# Patient Record
Sex: Female | Born: 1935 | Race: White | Hispanic: No | Smoking: Never smoker
Health system: Southern US, Community
[De-identification: ages and names within clinical notes are randomized; demographics above are authoritative.]

## PROBLEM LIST (undated history)

## (undated) DIAGNOSIS — T8859XA Other complications of anesthesia, initial encounter: Secondary | ICD-10-CM

## (undated) DIAGNOSIS — I10 Essential (primary) hypertension: Secondary | ICD-10-CM

## (undated) DIAGNOSIS — I679 Cerebrovascular disease, unspecified: Secondary | ICD-10-CM

## (undated) DIAGNOSIS — Z950 Presence of cardiac pacemaker: Secondary | ICD-10-CM

## (undated) DIAGNOSIS — K219 Gastro-esophageal reflux disease without esophagitis: Secondary | ICD-10-CM

## (undated) DIAGNOSIS — I480 Paroxysmal atrial fibrillation: Secondary | ICD-10-CM

## (undated) DIAGNOSIS — I495 Sick sinus syndrome: Secondary | ICD-10-CM

## (undated) DIAGNOSIS — I639 Cerebral infarction, unspecified: Secondary | ICD-10-CM

## (undated) DIAGNOSIS — E785 Hyperlipidemia, unspecified: Secondary | ICD-10-CM

## (undated) DIAGNOSIS — T4145XA Adverse effect of unspecified anesthetic, initial encounter: Secondary | ICD-10-CM

## (undated) DIAGNOSIS — J189 Pneumonia, unspecified organism: Secondary | ICD-10-CM

## (undated) DIAGNOSIS — R569 Unspecified convulsions: Secondary | ICD-10-CM

## (undated) DIAGNOSIS — M1990 Unspecified osteoarthritis, unspecified site: Secondary | ICD-10-CM

## (undated) DIAGNOSIS — I2699 Other pulmonary embolism without acute cor pulmonale: Secondary | ICD-10-CM

## (undated) DIAGNOSIS — C50919 Malignant neoplasm of unspecified site of unspecified female breast: Secondary | ICD-10-CM

## (undated) DIAGNOSIS — I219 Acute myocardial infarction, unspecified: Secondary | ICD-10-CM

## (undated) DIAGNOSIS — A048 Other specified bacterial intestinal infections: Secondary | ICD-10-CM

## (undated) HISTORY — DX: Hyperlipidemia, unspecified: E78.5

## (undated) HISTORY — DX: Gastro-esophageal reflux disease without esophagitis: K21.9

## (undated) HISTORY — PX: TONSILLECTOMY: SUR1361

## (undated) HISTORY — DX: Unspecified osteoarthritis, unspecified site: M19.90

## (undated) HISTORY — DX: Cerebral infarction, unspecified: I63.9

## (undated) HISTORY — DX: Acute myocardial infarction, unspecified: I21.9

## (undated) HISTORY — PX: MASTECTOMY: SHX3

## (undated) HISTORY — DX: Cerebrovascular disease, unspecified: I67.9

## (undated) HISTORY — DX: Unspecified convulsions: R56.9

## (undated) HISTORY — DX: Essential (primary) hypertension: I10

## (undated) HISTORY — DX: Other pulmonary embolism without acute cor pulmonale: I26.99

## (undated) HISTORY — DX: Malignant neoplasm of unspecified site of unspecified female breast: C50.919

## (undated) HISTORY — DX: Other specified bacterial intestinal infections: A04.8

## (undated) HISTORY — DX: Paroxysmal atrial fibrillation: I48.0

## (undated) HISTORY — DX: Sick sinus syndrome: I49.5

---

## 1998-07-14 ENCOUNTER — Other Ambulatory Visit: Admission: RE | Admit: 1998-07-14 | Discharge: 1998-07-14 | Payer: Self-pay | Admitting: Oncology

## 1998-10-27 ENCOUNTER — Other Ambulatory Visit: Admission: RE | Admit: 1998-10-27 | Discharge: 1998-10-27 | Payer: Self-pay | Admitting: *Deleted

## 1998-11-17 ENCOUNTER — Encounter: Admission: RE | Admit: 1998-11-17 | Discharge: 1998-12-25 | Payer: Self-pay | Admitting: General Surgery

## 2000-06-23 ENCOUNTER — Encounter: Admission: RE | Admit: 2000-06-23 | Discharge: 2000-07-19 | Payer: Self-pay | Admitting: Surgery

## 2000-08-16 ENCOUNTER — Encounter: Admission: RE | Admit: 2000-08-16 | Discharge: 2000-09-27 | Payer: Self-pay | Admitting: Surgery

## 2000-12-20 DIAGNOSIS — A048 Other specified bacterial intestinal infections: Secondary | ICD-10-CM

## 2000-12-20 HISTORY — DX: Other specified bacterial intestinal infections: A04.8

## 2001-01-24 ENCOUNTER — Encounter: Payer: Self-pay | Admitting: Emergency Medicine

## 2001-01-24 ENCOUNTER — Inpatient Hospital Stay (HOSPITAL_COMMUNITY): Admission: EM | Admit: 2001-01-24 | Discharge: 2001-01-25 | Payer: Self-pay | Admitting: Emergency Medicine

## 2001-03-06 ENCOUNTER — Encounter: Payer: Self-pay | Admitting: Gastroenterology

## 2001-03-06 ENCOUNTER — Ambulatory Visit (HOSPITAL_COMMUNITY): Admission: RE | Admit: 2001-03-06 | Discharge: 2001-03-06 | Payer: Self-pay | Admitting: Gastroenterology

## 2001-03-06 ENCOUNTER — Encounter (INDEPENDENT_AMBULATORY_CARE_PROVIDER_SITE_OTHER): Payer: Self-pay | Admitting: *Deleted

## 2001-03-21 ENCOUNTER — Encounter (INDEPENDENT_AMBULATORY_CARE_PROVIDER_SITE_OTHER): Payer: Self-pay | Admitting: *Deleted

## 2001-03-21 ENCOUNTER — Ambulatory Visit (HOSPITAL_COMMUNITY): Admission: RE | Admit: 2001-03-21 | Discharge: 2001-03-21 | Payer: Self-pay | Admitting: Gastroenterology

## 2001-05-16 ENCOUNTER — Ambulatory Visit (HOSPITAL_COMMUNITY): Admission: RE | Admit: 2001-05-16 | Discharge: 2001-05-16 | Payer: Self-pay | Admitting: Gastroenterology

## 2001-05-16 ENCOUNTER — Encounter (INDEPENDENT_AMBULATORY_CARE_PROVIDER_SITE_OTHER): Payer: Self-pay | Admitting: *Deleted

## 2002-01-31 ENCOUNTER — Other Ambulatory Visit: Admission: RE | Admit: 2002-01-31 | Discharge: 2002-01-31 | Payer: Self-pay | Admitting: Obstetrics and Gynecology

## 2002-03-26 ENCOUNTER — Ambulatory Visit (HOSPITAL_COMMUNITY): Admission: RE | Admit: 2002-03-26 | Discharge: 2002-03-26 | Payer: Self-pay | Admitting: Obstetrics and Gynecology

## 2002-03-26 ENCOUNTER — Encounter (INDEPENDENT_AMBULATORY_CARE_PROVIDER_SITE_OTHER): Payer: Self-pay | Admitting: Specialist

## 2002-04-03 ENCOUNTER — Encounter: Admission: RE | Admit: 2002-04-03 | Discharge: 2002-04-03 | Payer: Self-pay | Admitting: *Deleted

## 2002-10-30 ENCOUNTER — Encounter: Payer: Self-pay | Admitting: Internal Medicine

## 2003-02-06 ENCOUNTER — Other Ambulatory Visit: Admission: RE | Admit: 2003-02-06 | Discharge: 2003-02-06 | Payer: Self-pay | Admitting: Obstetrics and Gynecology

## 2003-04-30 ENCOUNTER — Ambulatory Visit (HOSPITAL_COMMUNITY): Admission: RE | Admit: 2003-04-30 | Discharge: 2003-04-30 | Payer: Self-pay | Admitting: Endocrinology

## 2004-02-10 ENCOUNTER — Ambulatory Visit (HOSPITAL_COMMUNITY): Admission: RE | Admit: 2004-02-10 | Discharge: 2004-02-10 | Payer: Self-pay | Admitting: Surgery

## 2004-02-10 ENCOUNTER — Ambulatory Visit (HOSPITAL_BASED_OUTPATIENT_CLINIC_OR_DEPARTMENT_OTHER): Admission: RE | Admit: 2004-02-10 | Discharge: 2004-02-10 | Payer: Self-pay | Admitting: Surgery

## 2004-02-10 ENCOUNTER — Encounter (INDEPENDENT_AMBULATORY_CARE_PROVIDER_SITE_OTHER): Payer: Self-pay | Admitting: *Deleted

## 2004-02-14 ENCOUNTER — Other Ambulatory Visit: Admission: RE | Admit: 2004-02-14 | Discharge: 2004-02-14 | Payer: Self-pay | Admitting: Obstetrics and Gynecology

## 2005-12-20 DIAGNOSIS — I495 Sick sinus syndrome: Secondary | ICD-10-CM

## 2005-12-20 HISTORY — PX: PACEMAKER INSERTION: SHX728

## 2005-12-20 HISTORY — DX: Sick sinus syndrome: I49.5

## 2006-03-03 ENCOUNTER — Inpatient Hospital Stay (HOSPITAL_COMMUNITY): Admission: EM | Admit: 2006-03-03 | Discharge: 2006-03-08 | Payer: Self-pay | Admitting: Emergency Medicine

## 2006-03-03 ENCOUNTER — Ambulatory Visit: Payer: Self-pay | Admitting: Internal Medicine

## 2006-03-04 ENCOUNTER — Encounter: Payer: Self-pay | Admitting: Internal Medicine

## 2006-03-04 ENCOUNTER — Encounter: Payer: Self-pay | Admitting: Vascular Surgery

## 2006-04-27 ENCOUNTER — Ambulatory Visit: Payer: Self-pay | Admitting: Internal Medicine

## 2006-05-17 ENCOUNTER — Encounter: Admission: RE | Admit: 2006-05-17 | Discharge: 2006-06-13 | Payer: Self-pay | Admitting: *Deleted

## 2006-06-14 ENCOUNTER — Encounter: Admission: RE | Admit: 2006-06-14 | Discharge: 2006-07-06 | Payer: Self-pay | Admitting: *Deleted

## 2006-07-07 ENCOUNTER — Encounter: Admission: RE | Admit: 2006-07-07 | Discharge: 2006-07-14 | Payer: Self-pay | Admitting: *Deleted

## 2006-09-24 ENCOUNTER — Inpatient Hospital Stay (HOSPITAL_COMMUNITY): Admission: EM | Admit: 2006-09-24 | Discharge: 2006-09-25 | Payer: Self-pay | Admitting: Emergency Medicine

## 2006-10-18 ENCOUNTER — Ambulatory Visit (HOSPITAL_COMMUNITY): Admission: RE | Admit: 2006-10-18 | Discharge: 2006-10-19 | Payer: Self-pay | Admitting: *Deleted

## 2007-06-26 ENCOUNTER — Encounter (HOSPITAL_BASED_OUTPATIENT_CLINIC_OR_DEPARTMENT_OTHER): Admission: RE | Admit: 2007-06-26 | Discharge: 2007-09-12 | Payer: Self-pay | Admitting: Internal Medicine

## 2007-07-26 ENCOUNTER — Inpatient Hospital Stay (HOSPITAL_COMMUNITY): Admission: EM | Admit: 2007-07-26 | Discharge: 2007-08-01 | Payer: Self-pay | Admitting: Emergency Medicine

## 2007-07-26 ENCOUNTER — Ambulatory Visit: Payer: Self-pay | Admitting: Cardiology

## 2009-08-15 ENCOUNTER — Emergency Department (HOSPITAL_COMMUNITY): Admission: EM | Admit: 2009-08-15 | Discharge: 2009-08-15 | Payer: Self-pay | Admitting: Emergency Medicine

## 2009-09-09 ENCOUNTER — Telehealth: Payer: Self-pay | Admitting: Internal Medicine

## 2009-10-28 ENCOUNTER — Ambulatory Visit: Payer: Self-pay | Admitting: Internal Medicine

## 2009-10-28 DIAGNOSIS — K219 Gastro-esophageal reflux disease without esophagitis: Secondary | ICD-10-CM | POA: Insufficient documentation

## 2009-10-28 DIAGNOSIS — T17308A Unspecified foreign body in larynx causing other injury, initial encounter: Secondary | ICD-10-CM

## 2009-10-28 DIAGNOSIS — R1319 Other dysphagia: Secondary | ICD-10-CM

## 2009-10-30 ENCOUNTER — Ambulatory Visit (HOSPITAL_COMMUNITY): Admission: RE | Admit: 2009-10-30 | Discharge: 2009-10-30 | Payer: Self-pay | Admitting: Internal Medicine

## 2009-11-06 ENCOUNTER — Ambulatory Visit (HOSPITAL_COMMUNITY): Admission: RE | Admit: 2009-11-06 | Discharge: 2009-11-06 | Payer: Self-pay | Admitting: Internal Medicine

## 2009-11-06 ENCOUNTER — Encounter: Payer: Self-pay | Admitting: Internal Medicine

## 2010-04-21 ENCOUNTER — Encounter: Payer: Self-pay | Admitting: Internal Medicine

## 2010-09-23 ENCOUNTER — Telehealth (INDEPENDENT_AMBULATORY_CARE_PROVIDER_SITE_OTHER): Payer: Self-pay | Admitting: *Deleted

## 2010-10-03 ENCOUNTER — Encounter: Payer: Self-pay | Admitting: Internal Medicine

## 2010-10-27 ENCOUNTER — Encounter: Payer: Self-pay | Admitting: Internal Medicine

## 2010-11-06 ENCOUNTER — Ambulatory Visit: Payer: Self-pay | Admitting: Internal Medicine

## 2010-11-06 DIAGNOSIS — I10 Essential (primary) hypertension: Secondary | ICD-10-CM

## 2010-11-06 DIAGNOSIS — I498 Other specified cardiac arrhythmias: Secondary | ICD-10-CM | POA: Insufficient documentation

## 2010-11-06 DIAGNOSIS — I4891 Unspecified atrial fibrillation: Secondary | ICD-10-CM | POA: Insufficient documentation

## 2011-01-12 ENCOUNTER — Encounter (HOSPITAL_BASED_OUTPATIENT_CLINIC_OR_DEPARTMENT_OTHER)
Admission: RE | Admit: 2011-01-12 | Discharge: 2011-01-19 | Payer: Self-pay | Source: Home / Self Care | Attending: General Surgery | Admitting: General Surgery

## 2011-01-18 ENCOUNTER — Ambulatory Visit
Admission: RE | Admit: 2011-01-18 | Discharge: 2011-01-18 | Payer: Self-pay | Source: Home / Self Care | Attending: Vascular Surgery | Admitting: Vascular Surgery

## 2011-01-19 NOTE — Miscellaneous (Signed)
Summary: Device preload  Clinical Lists Changes  Observations: Added new observation of PPM INDICATN: Sick sinus syndrome (10/03/2010 11:46) Added new observation of MAGNET RTE: BOL 85 ERI 65 (10/03/2010 11:46) Added new observation of PPMLEADSTAT2: active (10/03/2010 11:46) Added new observation of PPMLEADSER2: [id number] (10/03/2010 11:46) Added new observation of PPMLEADMOD2: 5076  (10/03/2010 11:46) Added new observation of PPMLEADDOI2: 10/18/2006  (10/03/2010 11:46) Added new observation of PPMLEADLOC2: RV  (10/03/2010 11:46) Added new observation of PPMLEADSTAT1: active  (10/03/2010 11:46) Added new observation of PPMLEADSER1: [id number]  (10/03/2010 11:46) Added new observation of PPMLEADMOD1: 5076  (10/03/2010 11:46) Added new observation of PPMLEADDOI1: 10/18/2006  (10/03/2010 11:46) Added new observation of PPMLEADLOC1: RA  (10/03/2010 11:46) Added new observation of PPM IMP MD: Charlynn Court  (10/03/2010 11:46) Added new observation of PPM DOI: 10/18/2006  (10/03/2010 11:46) Added new observation of PPM SERL#: [id number]  (10/03/2010 11:46) Added new observation of PPM MODL#: [id number]  (10/03/2010 11:46) Added new observation of PACEMAKERMFG: Medtronic  (10/03/2010 11:46) Added new observation of PPM REFER MD: Chip Boer  (10/03/2010 11:46) Added new observation of PACEMAKER MD: Hillis Range, MD  (10/03/2010 11:46)      PPM Specifications Following MD:  Hillis Range, MD     Referring MD:  Chip Boer PPM Vendor:  Medtronic     PPM Model Number:  [id number]     PPM Serial Number:  [id number] PPM DOI:  10/18/2006     PPM Implanting MD:  Charlynn Court  Lead 1    Location: RA     DOI: 10/18/2006     Model #: [id number]     Serial #: [id number]     Status: active Lead 2    Location: RV     DOI: 10/18/2006     Model #: [id number]     Serial #: [id number]     Status: active  Magnet Response Rate:  BOL 85 ERI 65  Indications:  Sick sinus syndrome

## 2011-01-19 NOTE — Cardiovascular Report (Signed)
Summary: Office Visit   Office Visit   Imported By: Roderic Ovens 11/06/2010 16:39:16  _____________________________________________________________________  External Attachment:    Type:   Image     Comment:   External Document

## 2011-01-19 NOTE — Letter (Signed)
Summary: Medication / Emergency Contact Document brought in by Patient  Medication / Emergency Contact Document brought in by Patient   Imported By: Lennie Odor 10/28/2010 11:58:20  _____________________________________________________________________  External Attachment:    Type:   Image     Comment:   External Document

## 2011-01-19 NOTE — Progress Notes (Signed)
Summary: pt calling re appt with device clinic  Phone Note Call from Patient   Caller: Patient [phone] Reason for Call: Talk to Nurse Summary of Call: pt of dr Jess Barters ck due nov 1, was told we would send a letter about rs to our office, but there were certain steps that she needed to take before scheduling   Initial call taken by: Glynda Jaeger,  September 23, 2010 9:25 AM  Follow-up for Phone Call        Spoke with patient and reassured her that we had all her current pacemaker information and we will be in touch to schedule a November appt. with the device clinic for a pacemaker check. Follow-up by: Altha Harm, LPN,  September 23, 2010 6:32 PM

## 2011-01-19 NOTE — Assessment & Plan Note (Signed)
Summary: pacer check/medtronic   Visit Type:  NEW PATIENT Referring Provider:  Dr Elease Hashimoto Primary Provider:  Adrian Prince, MD   History of Present Illness: Danielle Roth is a very pleasant and active 77 yo WF with a h/o HTN, paroxysmal atrial fibrillation, s/p PPM for tachy/bradycardia who presents today to establish care in the pacemaker clinic.  She underwent pacemaker implantation by Dr Reyes Ivan 10/18/06 for sick sinus syndrome.  She reports doing very well since that time.  She remains quite active despite her age.  Her primary concern is nonhealing wound/ pain on her L foot for which she is followed by Dr Evlyn Kanner. Today, she denies symptoms of palpitations, chest pain, shortness of breath, orthopnea, PND, lower extremity edema, dizziness, presyncope, syncope, or neurologic sequela. The patient is tolerating medications without difficulties and is otherwise without complaint today.    Current Medications (verified): 1)  Keppra 250 Mg Tabs (Levetiracetam) .Marland Kitchen.. 1 By Mouth Two Times A Day 2)  Pravachol 40 Mg Tabs (Pravastatin Sodium) .Marland Kitchen.. 1 By Mouth Qd 3)  Prilosec 20 Mg Cpdr (Omeprazole) .Marland Kitchen.. 1 By Mouth Qd 4)  Warfarin Sodium 3 Mg Tabs (Warfarin Sodium) .... Use As Directed By Anticoagualtion Clinic 5)  Glycolax  Powd (Polyethylene Glycol 3350) .... Prn 6)  Timolol Maleate 0.5 % Soln (Timolol Maleate) .... Bid 7)  Lumigan 0.03 % Soln (Bimatoprost) .... Q Hs 8)  Caltrate 600+d Plus 600-400 Mg-Unit Tabs (Calcium Carbonate-Vit D-Min) .Marland Kitchen.. 1 By Mouth Bid 9)  Multivitamins  Tabs (Multiple Vitamin) .... Once Daily 10)  Flonase 50 Mcg/act Susp (Fluticasone Propionate) .... Instill Drops Once Daily 11)  Benazepril Hcl 20 Mg Tabs (Benazepril Hcl) .... Once Daily 12)  Nitrostat 0.4 Mg Subl (Nitroglycerin) .Marland Kitchen.. 1 Tablet Under Tongue At Onset of Chest Pain; You May Repeat Every 5 Minutes For Up To 3 Doses. 13)  Sanctura Xr 60 Mg Xr24h-Cap (Trospium Chloride) .... Once Daily 14)  Allopurinol 300 Mg Tabs  (Allopurinol) .... Once Daily 15)  Systane 0.4-0.3 % Soln (Polyethyl Glycol-Propyl Glycol) .... Uad  Allergies: 1)  ! Sulfa 2)  ! Codeine 3)  ! Iodine  Past History:  Past Medical History: Breast Cancer s/p L mastectomy Glaucoma Hyperlipidemia Hypertension Stroke paroxysmal atrial fibrillation, on coumadin gout SSS s/p PPM 2007 by Dr Reyes Ivan  Past Surgical History: Reviewed history from 11/05/2010 and no changes required. Breast-Mastectomy Left Tonsillectomy Dual-chamber permanent pacemaker implantation-2007  Family History: Reviewed history from 10/28/2009 and no changes required. No FH of Colon Cancer: Family History of Heart Disease: Both Parents Family History of Kidney Disease:Mother  Social History: Occupation: Retired and lives in assisted living in [place]. Patient has never smoked.  Daily Caffeine Use -2 Illicit Drug Use - no Alcohol Use - no  Code status: we discussed in detail today and she wishes to be DNR/DNI  Review of Systems       All systems are reviewed and negative except as listed in the HPI.   Vital Signs:  Patient profile:   77 year old female Height:      65.5 inches Weight:      183 pounds BMI:     30.10 Pulse rate:   64 / minute BP sitting:   144 / 72  (left arm)  Vitals Entered By: Laurance Flatten CMA (November 06, 2010 10:06 AM)  Physical Exam  General:  elderly female, NAD, walks slowly with a rolling walker Head:  normocephalic and atraumatic Eyes:  PERRLA/EOM intact; conjunctiva and lids normal. Mouth:  Teeth, gums and palate normal. Oral mucosa normal. Neck:  supple, no bruits Chest Wall:  R sided pacemaker pocket is well healed Lungs:  Clear bilaterally to auscultation and percussion. Heart:  RRR, no m/r/g Abdomen:  Bowel sounds positive; abdomen soft and non-tender without masses, organomegaly, or hernias noted. No hepatosplenomegaly. Msk:  diffuse muscle atrophy Extremities:  No clubbing or cyanosis.  L foot dressing  is c/d/i Neurologic:  Alert and oriented x 3.   PPM Specifications Following MD:  Hillis Range, MD     Referring MD:  Chip Boer PPM Vendor:  [employer]onic     PPM Model Number:  [id number]     PPM Serial Number:  [id number] PPM DOI:  10/18/2006     PPM Implanting MD:  Charlynn Court  Lead 1    Location: RA     DOI: 10/18/2006     Model #: 5076     Serial #: [id number]     Status: active Lead 2    Location: RV     DOI: 10/18/2006     Model #: 3086     Serial #: [id number]     Status: active  Magnet Response Rate:  BOL 85 ERI 65  Indications:  Sick sinus syndrome   PPM Follow Up Remote Check?  No Battery Voltage:  3.00 V     Pacer Dependent:  No       PPM Device Measurements Atrium  Amplitude: 3.6 mV, Impedance: 416 ohms, Threshold: 1.0 V at 0.4 msec Right Ventricle  Amplitude: 12.8 mV, Impedance: 488 ohms, Threshold: 0.5 V at 0.4 msec  Episodes Danielle Episodes:  26     Percent Mode Switch:  1.5%     Coumadin:  Yes Atrial Pacing:  91.6%     Ventricular Pacing:  0.2%  Parameters Mode:  DDDR+     Lower Rate Limit:  60     Upper Rate Limit:  130 Paced AV Delay:  180     Sensed AV Delay:  150 Next Cardiology Appt Due:  04/20/2011 Tech Comments:  No parameter changes.  Device function normal.  Ventricular rates > 100bpm during A-fib, + coumadin.  Rate response blunted but adequate for the patients level of activity.   No Carelink @ this time.  ROV 6 months clinic. Altha Harm, LPN  November 06, 2010 10:16 AM  MD Comments:  agree  Impression & Recommendations:  Problem # 1:  ATRIAL FIBRILLATION (ICD-427.31) stable continue coumadin for stroke prevention  Problem # 2:  BRADYCARDIA (ICD-427.89) normal pacemaker function no changes today  Problem # 3:  ESSENTIAL HYPERTENSION, BENIGN (ICD-401.1) stable no changes today  Patient Instructions: 1)  Your physician wants you to follow-up in: 6 months in the device clinic on Tues/Thurs/Fri  You will receive a reminder letter in the  mail two months in advance. If you don't receive a letter, please call our office to schedule the follow-up appointment.

## 2011-01-21 NOTE — Letter (Signed)
Summary: [facility] Progress Note   [facility] Progress Note   Imported By: Roderic Ovens 01/05/2011 14:56:04  _____________________________________________________________________  External Attachment:    Type:   Image     Comment:   External Document

## 2011-01-26 ENCOUNTER — Encounter (HOSPITAL_BASED_OUTPATIENT_CLINIC_OR_DEPARTMENT_OTHER): Payer: Medicare Other | Attending: General Surgery

## 2011-01-26 DIAGNOSIS — L97509 Non-pressure chronic ulcer of other part of unspecified foot with unspecified severity: Secondary | ICD-10-CM | POA: Insufficient documentation

## 2011-01-26 DIAGNOSIS — M1A9XX1 Chronic gout, unspecified, with tophus (tophi): Secondary | ICD-10-CM | POA: Insufficient documentation

## 2011-01-27 NOTE — Progress Notes (Signed)
Summary: Patient Medical History and Concerns   Patient Medical History and Concerns   Imported By: Roderic Ovens 01/15/2011 13:15:03  _____________________________________________________________________  External Attachment:    Type:   Image     Comment:   External Document

## 2011-03-02 ENCOUNTER — Encounter (HOSPITAL_BASED_OUTPATIENT_CLINIC_OR_DEPARTMENT_OTHER): Payer: Medicare Other | Attending: Plastic Surgery

## 2011-03-02 DIAGNOSIS — L97509 Non-pressure chronic ulcer of other part of unspecified foot with unspecified severity: Secondary | ICD-10-CM | POA: Insufficient documentation

## 2011-03-02 DIAGNOSIS — M1A9XX1 Chronic gout, unspecified, with tophus (tophi): Secondary | ICD-10-CM | POA: Insufficient documentation

## 2011-03-09 NOTE — Miscellaneous (Signed)
Summary: Declaration of a Desire for a Natural Death  Declaration of a Desire for a Natural Death   Imported By: Lenard Forth 03/02/2011 17:44:58  _____________________________________________________________________  External Attachment:    Type:   Image     Comment:   External Document

## 2011-03-23 ENCOUNTER — Encounter (HOSPITAL_BASED_OUTPATIENT_CLINIC_OR_DEPARTMENT_OTHER): Payer: Medicare Other

## 2011-03-27 LAB — CBC
Hemoglobin: 13.6 g/dL (ref 12.0–15.0)
MCV: 89.4 fL (ref 78.0–100.0)
RBC: 4.44 MIL/uL (ref 3.87–5.11)

## 2011-03-27 LAB — CK TOTAL AND CKMB (NOT AT ARMC)
Relative Index: INVALID (ref 0.0–2.5)
Total CK: 63 U/L (ref 7–177)

## 2011-03-27 LAB — PROTIME-INR: Prothrombin Time: 26.4 seconds — ABNORMAL HIGH (ref 11.6–15.2)

## 2011-03-27 LAB — BASIC METABOLIC PANEL
BUN: 20 mg/dL (ref 6–23)
CO2: 23 mEq/L (ref 19–32)
Chloride: 108 mEq/L (ref 96–112)
Creatinine, Ser: 0.94 mg/dL (ref 0.4–1.2)
GFR calc Af Amer: >60 mL/min (ref >60)
Glucose, Bld: 99 mg/dL (ref 70–99)
Potassium: 3.9 mEq/L (ref 3.5–5.1)
Sodium: 138 mEq/L (ref 135–145)

## 2011-03-27 LAB — DIFFERENTIAL
Eosinophils Absolute: 0.3 10*3/uL (ref 0.0–0.7)
Eosinophils Relative: 3 % (ref 0–5)
Lymphs Abs: 3.2 10*3/uL (ref 0.7–4.0)
Monocytes Absolute: 1.1 10*3/uL — ABNORMAL HIGH (ref 0.1–1.0)
Monocytes Relative: 13 % — ABNORMAL HIGH (ref 3–12)

## 2011-03-27 LAB — TROPONIN I: Troponin I: 0.03 ng/mL (ref 0.00–0.06)

## 2011-03-27 LAB — APTT: aPTT: 38 seconds — ABNORMAL HIGH (ref 24–37)

## 2011-05-04 NOTE — Assessment & Plan Note (Signed)
[facility]   NAME:  Danielle Roth, Danielle Roth           ACCOUNT NO.:  [id number]   MEDICAL RECORD NO.:  [id number]      DATE OF BIRTH:  1918-02-09   PHYSICIAN:  Maxwell Caul, M.D. VISIT DATE:  07/14/2007                                   OFFICE VISIT   PURPOSE OF TODAY'S VISIT:  [name] is an  77 year old lady we have  been following for a stasis ulcer that was initially traumatic involving  her left leg.  When this initially happened it was probably hematoma.  We debrided this when she first came here on July 10.  Since then she  has been treated with Silverlon and an Unna wrap.  She does not report  any difficulties with the wrap, excessive pain, or fever.   EXAMINATION:  The wound itself is measuring 2.3 x 1.5 x 0.1.  This is  well granulated, in fact on one edge of this it almost appears  hypergranulated.  Nevertheless the wound was clean and had no evidence  of infection, nothing needed to be debrided.   IMPRESSION:  Improved traumatic ulcer complicated by venous stasis.  I  have continued the Silverlon together with an Unna wrap, the edema is  well controlled.  The wound looks well granulated to me.  We will  hopefully see some degree of epithelialization soon.  Generally this  wound appears much improved to me.           ______________________________  Maxwell Caul, M.D.     MGR/MEDQ  D:  07/14/2007  T:  07/15/2007  Job:  [id number]

## 2011-05-04 NOTE — Assessment & Plan Note (Signed)
[facility]   NAME:  DANNELLE, RHYMES           ACCOUNT NO.:  [id number]   MEDICAL RECORD NO.:  [id number]      DATE OF BIRTH:  April 03, 1918   PHYSICIAN:  Theresia Majors. Tanda Rockers, M.D. VISIT DATE:  07/20/2007                                   OFFICE VISIT   SUBJECTIVE:  Ms. Beazley returns for followup of a stasis ulcer  involving her left lower extremity.  In the interim, we have treated her  with compression wrap and Silverlon.  There has been no interim  excessive drainage, malodor, pain or fever.   OBJECTIVE:  Inspection of the left lower extremity shows that there has  been adequate compression as manifested by lineal wrinkles.  There is  some desquamation of the skin.  The foot is warm with a palpable pulse.  The ulcer itself shows healthy granulation with advancing epithelium at  the periphery.   ASSESSMENT:  Clinical improvement of stasis ulcer.   PLAN:  We will resume the Unna wrap with triamcinolone cream and  SofSorb.  We will re-evaluate her in 1 week.      Harold A. Tanda Rockers, M.D.  Electronically Signed     HAN/MEDQ  D:  07/20/2007  T:  07/21/2007  Job:  [id number]

## 2011-05-04 NOTE — Consult Note (Signed)
NAMEDELARA, Danielle Roth           ACCOUNT NO.:  [id number]   MEDICAL RECORD NO.:  [id number]          PATIENT TYPE:  INP   LOCATION:  4735                         FACILITY:  [facility]   PHYSICIAN:  Vesta Mixer, M.D. DATE OF BIRTH:  [birth year]   DATE OF CONSULTATION:  DATE OF DISCHARGE:                                 CONSULTATION   CARDIOLOGY CONSULT NOTE:   TOTAL VISIT TIME:  Approximately 43 minutes.   The patient is full code.  The patient was seen in the emergency room by  Dr. Quita Skye.  The patient was a good historian.  She complained of  some chest pain.   HISTORY OF PRESENT ILLNESS:  Danielle Roth is an 77 year old female with a  history of stroke x2, history of left bundle branch block, she has a  history of sick sinus syndrome status post dual-chamber pacemaker in  October of 2007 with presyncopal symptoms no acute distress bradycardia.  She has a history of right-sided weakness, residually after her stroke.  She also has a history of multiple falls and deconditioning after she  has been relatively sedentary and also a history of lymphedema with a  leg ulcer and significant swelling of the left lower extremity.  She has  a history of a seizure disorder after the stroke with multiple episodes.  She is unsure of the last time she had one.  History of left eye implant  in March of 2008.  History of  transient ischemic attack in October of  2007.  She has a history of breast cancer in 1993 with a left modified  radical mastectomy, Tamoxifen after.  She also has a history of  hypertension, history of glaucoma and she presents with chest pain.  Patient notes she was sitting at the dinner table at [facility], at approximately 5:20 p.m. she notes a sudden  onset of bilateral anterior chest pain, throbbing and deep, 10/10 with  radiation to bilateral arms, severe for about 20 minutes, associated  with severe shortness of breath with dyspnea on exertion  and sweats.  She denies any associated nausea or vomiting, but notes profound  dizziness with it.  The patient's chest pain was decreased after given  aspirin in the ambulance after 9-1-1 was called.  Her pain is a 3-4/10  at the time of interview.  The patient notes she had never had pain like  this before.  The patient was sent to the CT scanner in the emergency  room and was found to have a right upper and lower proximal pulmonary  thrombo embolus.   CARDIAC HISTORY:  Left heart catheterization was done on January 25, 2001 for chest pain showing nonobstructive coronary artery disease with  an ejection fraction by LV gram was 65% with no mitral regurgitation.  She had an adenosine stress test in 2001 that was negative, Cardiolite.   PAST MEDICAL HISTORY:  As above.   PAST SURGICAL HISTORY:  As above.   SOCIAL HISTORY:  She has never smoke or drank.  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   FAMILY HISTORY:  Positive for colon cancer and she had son that died at  the age of 56 of leukemia.   MEDICATIONS:  She is on:  1. Timolol 0.5% solution to each eyes twice a day.  2. Natural Tears 4 times a day.  3. Lumigan 1 drop to each eye q.h.s. 0.03% solution.  4. Aggrenox 200 twice a day.  5. Altace 5 mg daily.  6. Calcium plus D 600.  7. GlycoLax 17 grams as needed for constipation.  8. She is on a multivitamin.  9. She is also on Keppra 250 in the morning and 500 q.h.s.  10.Pravastatin 40 mg daily.  11.She is also on over-the-counter Prilosec.   ALLERGIES:  SHE IS ALLERGIC TO SULFA AND ALLERGIC TO TOPICAL IODINE.  SHE IS ALSO ALLERGIC TO CODEINE.  IT IS NOT CLEAR THE NATURE OF THE  ALLERGIES.   REVIEW OF SYSTEMS:  A 12-point review of systems, reviewed and negative,  unless as stated above.  She denies any fevers or cough.   PHYSICAL EXAMINATION:  VITAL SIGNS:  Temperature, she is afebrile at  98.9 with a blood pressure of 173/72, pulse of 61, respiratory  rate of  14, saturations are 100% on 2 liters nasal cannula.  GENERAL:  She is an elderly female that looks younger than her stated  age, sitting in bed in no acute distress.  HEENT:  Normocephalic, atraumatic.  Her pupils are equal, round and  reactive to light.  Her extraocular movements are intact.  Her  oropharynx is dry.  NECK:  Supple with no  lymphadenopathy, thyromegaly or carotid bruits.  LUNGS:  Clear to auscultation bilaterally, but she has poor effort.  CARDIOVASCULAR EXAM:  Regular rhythm and rate with ectopy with no  murmurs appreciated.  ABDOMEN:  Soft, nontender, nondistended.  No hepatosplenomegaly.  Normoactive bowel sounds.  EXTREMITIES:  She has got 2+-3+ bilateral lower extremity edema, worse  on the right side and she has bandages on the right leg.  NEUROLOGICAL EXAM:  Significant for 4-/5 strength on the right upper and  lower extremities.  It appears to be full on the left.   LABORATORY DATA:  Shows a white count of 10.6, with a hemoglobin of  13.8, hematocrit 39.8, platelets 186, with 67, lymphocytes 22.  Cardia  markers at 1900, showed a troponin of 1.8, 2 MB of 27.6 and an index of  11, CK of 252.  Basic metabolic panel showed a sodium of 139, potassium  4.4, chloride 105, bicarb 26, BUN 17, creatinine 0.98 with a glucose of  108, calcium 10.5.  INR 1.0, PTT 26, PT 13.6, D-dimer 2.83, which is  significantly elevated.  CT of chest as noted above.   The patient has an acute pulmonary thromboembolism and will remain on a  heparin drip. The patient's case was discussed with Dr. Elease Hashimoto and we  are discontinuing Integrilin drip here in the ER and we will discuss  with the emergency doctor for the best course of her further management.      Darryl D. Prime, MD  Electronically Signed     ______________________________  Vesta Mixer, M.D.   DDP/MEDQ  D:  07/26/2007  T:  07/27/2007  Job:  [id number]

## 2011-05-04 NOTE — Consult Note (Signed)
NAME:  Danielle Roth, SCAROLA NO.:  [id number]   MEDICAL RECORD NO.:  [id number]          PATIENT TYPE:  EMS   LOCATION:  MAJO                         FACILITY:  [facility]   PHYSICIAN:  Jake Bathe, MD      DATE OF BIRTH:  1918-10-08   DATE OF CONSULTATION:  08/15/2009  DATE OF DISCHARGE:                                 CONSULTATION   CARDIOLOGIST:  Vesta Mixer, MD   PRIMARY CARE PHYSICIAN:  Tera Mater. Evlyn Kanner, MD   REASON FOR CONSULTATION:  Dr. Norlene Campbell of [facility] Emergency Department  requested consultation for the evaluation of chest pain.   HISTORY OF PRESENT ILLNESS:  An 77 year old female with a history of  prior pacemaker placement, pulmonary embolism in 2008 on chronic  Coumadin therapy, 2 prior MIs in the past with cardiac catheterization  last being performed in 2002 showing nonobstructive disease per Dr.  Harvie Bridge last note, hypertension, hyperlipidemia, prior TIA who  presented to [facility] Emergency Department after experiencing chest  pain last night.  At approximately 2 a.m. while sleeping, she woke up  with a central chest pressure as well as discomfort in both hands  bilaterally described as a numbness in her hands and she also had  numbness in her feet bilaterally.  This occurred at 2 a.m. and worried  her.  During that experience, she had some mild shortness of breath.  EMS was called and in the ambulance she was given 3 nitroglycerin which  did nothing for the chest discomfort or arm discomfort.  This did  however make her headache, which had also been concomitant during the  episode of chest discomfort at 2 a.m. worse.  When she was in the  emergency department, an IV was started and she stated that after the IV  was started, her chest pain and symptoms abated.  No specific  medications were given through the IV.   Her first set of cardiac biomarkers are normal.  Her lab work is  unremarkable.  See below.  Chest x-ray showed no evidence of any  acute  changes.  Pacemaker present.   Currently when interviewing her at approximately 7:30 a.m., she is  asymptomatic, not complaining of any chest discomfort, shortness of  breath, or arm pain.  During physical exam, I was able to reproduce the  head and neck discomfort as well as some of the chest discomfort with  palpation.  She admits to having a stressful day yesterday, going to 3  separate doctors' offices on the [place] transportation system from her  nursing home facility.   As of note in 2008, Dr. Elease Hashimoto saw her in consultation after cardiac  biomarkers were elevated and she did in fact have a right-sided  pulmonary embolism at that time.  She has been on chronic Coumadin  therapy with an INR today of 2.5.  She also qualifies for Coumadin  therapy given prior history of atrial fibrillation.  Currently, she is  in sinus rhythm.  She has a caregiver who is at her bedside with her  currently.   PAST MEDICAL HISTORY:  1. Status post  pacemaker.  2. Hypertension.  3. Hyperlipidemia.  4. Two prior heart attacks according to the patient.  She did not      recall having a cardiac catheterization performed; however, one was      performed in February 2002, which showed minor luminal      irregularities with normal LV function.  5. Chronic left bundle-branch block.  6. Seizure disorder on Keppra.  7. Stroke x2 with TIA in October 2007.  8. Glaucoma.  9. Bilateral leg neuropathy.  10.Prior left modified radical mastectomy in 1993.  11.Sick sinus syndrome - Medtronic pacemaker placed.   ALLERGIES:  CODEINE, SULFA DRUGS, IODINE.   MEDICATIONS:  Currently on,  1. Coumadin 4.5 mg once a day.  2. Keppra 250 mg in the morning, 500 mg in the evening.  3. Pravastatin 40 mg at bedtime.  4. Prilosec 20 mg a day.  5. Multivitamin once a day.  6. MiraLax once a day.  7. Timolol eye drops as well as Lumigan eye drops once a day.  8. Altace 5 mg a day.  9. Calcium and vitamin D once a  day.   FAMILY HISTORY:  She states that both her mother and father died of  heart attacks, her mother at age 84 and her father at age 76.  Otherwise  noncontributory.   SOCIAL HISTORY:  She denies any alcohol, tobacco, or illicit drug use,  and she currently resides at [facility] at [facility].   REVIEW OF SYSTEMS:  She does have occasional joint pains.  She denies  any recent syncope, orthopnea, PND, bleeding problems, recent stroke, or  stroke-like symptoms.  No rashes.  She does admit to increased stress  recently.  Unless specified above, all other 12 review of systems  negative.   PHYSICAL EXAMINATION:  VITAL SIGNS:  Blood pressure 146/54, pulse 64  paced currently on telemetry, respirations 16, sating 100% on room air,  temperature is 97.8.  GENERAL:  Alert and oriented in no acute distress, resting comfortably  in bed with her caregiver at bedside, pleasant.  HEENT:  Eyes:  Well-perfused conjunctivae.  EOMI.  No scleral icterus.  NECK:  Supple.  No lymphadenopathy.  No JVD.  No carotid bruits  appreciated.  No thyromegaly.  Upon palpation of the posterior neck, I  was able to reproduce some of her headache symptoms as well as neck  discomfort.  CARDIOVASCULAR:  Regular rate and rhythm without any appreciable  murmurs, rubs, or gallops noted.  Normal PMI.  LUNGS:  Clear to auscultation bilaterally.  Normal respiratory effort.  ABDOMEN:  Soft, nontender.  Normoactive bowel sounds.  Mildly  protuberant.  No bruits.  EXTREMITIES:  No clubbing, cyanosis, or edema with palpable distal  pulses bilaterally.  2+ radial pulses bilaterally.  NEUROLOGIC:  Nonfocal.  No tremors noted.  Normal strength bilaterally.  SKIN:  Warm, dry, and intact.  No rashes noted.  Note, I was able to  reproduce some of her chest discomfort by palpation over chest wall.   DATA:  Prior ECG demonstrated atrial fibrillation with left bundle-  branch block morphology.  No ST  changes noted.  Chest x-ray personally  viewed shows mild cardiomegaly with no acute airspace disease, pacemaker  present.   LABORATORY DATA:  Electrolytes are unremarkable.  CBC is unremarkable  with hemoglobin of 13.6, white count of 8.4, and creatinine is 0.9.  First set of cardiac biomarkers are normal with CK of 71, MB of 2.3,  troponin of 0.02.   ASSESSMENT/PLAN:  An 77 year old female with pacemaker, sick sinus  syndrome, hypertension, hyperlipidemia, prior strokes, prior PE on  therapeutic Coumadin with atypical chest discomfort, which has resolved.  1. Chest pain - she is currently very comfortable in bed with      resolution of her symptoms.  The symptoms resolved she states after      placing the IV.  There was no specific medication given.  She      actually stated she was convinced that some of this was indigestion      to the emergency room physician.  Her first set of cardiac      biomarkers are unremarkable and her EKG is reassuring with left      bundle-branch block, no change.  Chest x-ray shows no evidence of      any pneumonias or any other airspace disease.  She also shows no      evidence of any rashes on her chest wall.  I was able to reproduce      this discomfort from deep palpation and perhaps her chest      discomfort as well as neck discomfort was secondary to      musculoskeletal tension.  I have discussed the case with Dr. Delane Ginger, her cardiologist and we both feel comfortable that if her      second set of cardiac biomarkers are normal that she should be safe      for discharge from the emergency department.  Luckily, she does      have an appointment with Dr. Elease Hashimoto this afternoon where he can      have close followup and reevaluation.  Certainly if any worrisome      symptoms develop or if cardiac biomarkers are abnormal, she will be      admitted to the hospital.  In regards to her prior pulmonary      embolism history, my suspicion for this  is very low and that she is      therapeutic on Coumadin, she is sating 100%, and she is complaining      of no shortness of breath at this time.  2. Hypertension - continue Altace.  3. Hyperlipidemia - continue pravastatin.  4. Pacemaker - functioning well, atrial paced on telemetry.  5. Prior transient ischemic attack - on Coumadin.      Jake Bathe, MD  Electronically Signed     MCS/MEDQ  D:  08/15/2009  T:  08/15/2009  Job:  [id number]   cc:   Marisa Severin, MD  Vesta Mixer, M.D.

## 2011-05-04 NOTE — Assessment & Plan Note (Signed)
[facility]   NAME:  Danielle Roth, Danielle Roth           ACCOUNT NO.:  [id number]   MEDICAL RECORD NO.:  [id number]      DATE OF BIRTH:  [birth year]   PHYSICIAN:  Theresia Majors. Tanda Rockers, M.D. VISIT DATE:  09/07/2007                                   OFFICE VISIT   SUBJECTIVE:  Miss Danielle Roth is an 77 year old lady who we had treated for  a posttraumatic stasis ulcer involving her left lower extremity.  In the  interim, she has worn an Unna wrap.  She returns for follow-up.  There  has been no excessive drainage, malodor, pain or fever.   OBJECTIVE:  Blood pressure is 133/60.  Respirations 16.  Pulse rate 65.  Temperature of 97.6.  Inspection of the left anterior lower extremity  shows that the wound is completely resolved.  There is persistence of 2+  edema, but no evidence of arterial insufficiency.   ASSESSMENT:  Resolved wound.   PLAN:  We are discharging the patient.  We have recommended that she  procure 30 to 40 mm below-the-knee compression hose.  The patient has  deferred the purchase of these garments pending her evaluation by an  orthopedist.      Theresia Majors. Tanda Rockers, M.D.  Electronically Signed     HAN/MEDQ  D:  09/07/2007  T:  09/08/2007  Job:  [id number]   cc:   Jeannett Senior A. Evlyn Kanner, M.D.

## 2011-05-04 NOTE — Discharge Summary (Signed)
Danielle Roth, Danielle Roth           ACCOUNT NO.:  [id number]   MEDICAL RECORD NO.:  [id number]          PATIENT TYPE:  INP   LOCATION:  4735                         FACILITY:  [facility]   PHYSICIAN:  Tera Mater. Evlyn Kanner, M.D. DATE OF BIRTH:  22-Nov-1918   DATE OF ADMISSION:  07/26/2007  DATE OF DISCHARGE:  08/01/2007                               DISCHARGE SUMMARY   DISCHARGE DIAGNOSES:  Are as follows:  1. New onset pulmonary embolism with approximate cause appearing to be      left lower extremity embolization due to stasis ulcer.  2. Distant history of breast cancer.  3. Hypertension, under control.  4. Seizure disorder, stable.  5. Hyperlipidemia, on therapy.  6. Prior strokes with last episode in October 2007.  7. Neuropathy.   Danielle Roth is an 77 year old white female, well-known patient to our  practice.  She presented to my partner, Dr. Ivery Quale,  on July 26, 2007 with acute onset of pulmonary embolism.  Patient has been under  treatment with an Unna boot to the left lower extremity and this had  caused immobility.  The patient has clinically done fairly well during  her hospitalization.  She was seen, initially, by the cardiologist, who  saw her during her acute episode, due to chest pain.  Patient, however,  was admitted to our service.  At the present time, she is now ambulated  fairly well.  O2 SATs are 97%.  She has been weaned off of supplemental  oxygen.  She has had no evidence of significant pulmonary hemorrhage and  has had no hemoptysis or pleuritic chest pain.  Her Coumadin has just  now become stable in the therapeutic range for the last 2 values in the  acceptable greater than 2 range.  Patient has had no bleeding  complications.  She did have some sweats last night, which I do not  think represent anything of significance.  She has had no fever.  Blood  pressure, this morning, is 135/71, pulse 61, respirations 20,  temperature 97.6, O2 SAT is 97%.   LABORATORY DATA:  Her PT/INR this morning is 2.0.  Yesterday was 2.0.  Before that was 1.8.  This was on a dose of 3 mg of Coumadin.  White  count yesterday was 7200, hemoglobin 12.2, platelets 182,000, MCV is  89.6.  Earlier laboratory data at presentation:  White count 10,600,  hemoglobin 13.8, platelets 180,000.  Chemistry:  Sodium 139, potassium  4.4, chloride 105, CO2 of 26, BUN 17, creatinine 0.98, glucose 108.  CK  was 252 with a relative index of 11.0 and a positive troponin of 1.82.  A BNP was slightly elevated at 395.   RADIOLOGY TESTING:  Included CT angio of the chest on July 26, 2007,  which showed right sided pulmonary emboli, cardiomegaly, mild basilar  atelectasis and scarring.  Chest x-ray was cardiomegaly without CHF.   IN SUMMARY:  We have a 77 year old white female, presenting with a  pulmonary embolism, due to left lower extremity DVT.  Patient was  actually seen and evaluated for research project while here, but  randomized to  standard therapy with Lovenox, bridged to Coumadin.  The  patient has done clinically well.  Has not been  hemodynamically  unstable.  She does have positive cardiac enzymes on the basis,  apparently, of this pulmonary embolism.  We are not going to pursue an  extensive cardiac workup, based on this, given the clinical situation.  She has had a prior cath that was fine about 4 years ago.  She has also  had an Adenosine Cardiolite, that was normal, as well.  At the present  time, she is clinically ready for discharge.  We had to wait until she  got therapeutic on her Coumadin.  Her dose at discharge will include 3  mg of Coumadin daily with a check in 2 days.  We will have her off her  Aggrenox until I have talked to neurology.  She will be on Altace 5  twice a day, calcium as before, Timoptic eye drops as before,  Toprol XL 25 daily.  Her Lovenox is ended.  She is actually also on  Keppra 250 in the morning and 500 at bedtime and pravastatin  40 mg  daily.  She is on Lumigan eye drops, as well timolol eye drops, as well.  Patient will be on a regular diet.  She will have follow up with me in 2  weeks after.  Of course, Coumadin will be checked in between.           ______________________________  Tera Mater. Evlyn Kanner, M.D.     SAS/MEDQ  D:  08/01/2007  T:  08/01/2007  Job:  [id number]

## 2011-05-04 NOTE — H&P (Signed)
Danielle Roth, Danielle Roth           ACCOUNT NO.:  [id number]   MEDICAL RECORD NO.:  [id number]          PATIENT TYPE:  INP   LOCATION:  4735                         FACILITY:  [facility]   PHYSICIAN:  Barry Dienes. Eloise Harman, M.D.DATE OF BIRTH:  11-May-1918   DATE OF ADMISSION:  07/26/2007  DATE OF DISCHARGE:                              HISTORY & PHYSICAL   CHIEF COMPLAINT:  Shortness of breath.   HISTORY OF PRESENT ILLNESS:  The patient is an 77 year old white female  who is a resident of the [facility] nursing facility who had the fairly  sudden onset of pleuritic chest pain since around 5:00 p.m. on the day  of admission.  This was associated with mild lightheadedness and  diaphoresis and bilateral arm pain (right greater than left) as well as  mild dyspnea on exertion.  She was transported to the emergency room for  a workup that showed mildly elevated cardiac isoenzymes and pulmonary  embolism.  At the time of my evaluation, she no longer had any chest  discomfort.   PAST MEDICAL HISTORY:  1. Chronic left lower extremity venous stasis ulcer on Unna boot      treatment.  2. October of 2007 - cardiac pacemaker placement for sick sinus      syndrome.  3. Left bundle branch block.  4. 1993 - diagnosis of breast cancer with a left modified radical      mastectomy.  5. Hypertension.  6. Seizure disorder.  7. Dyslipidemia.  8. Strokes x2 with a TIA in October 2007.  9. Glaucoma.  10.Bilateral leg neuropathy.   MEDICATIONS PRIOR TO ADMISSION:  1. Aggrenox one capsule twice daily.  2. Altace one tablet daily.  3. Calcium twice daily.  4. Timolol twice daily.   ALLERGIES:  1. CODEINE.  2. SULFA DRUGS.  3. IODINE.   PAST SURGICAL HISTORY:  1. 1950s - breast biopsies which were unremarkable.  2. 1993 - left modified radical mastectomy and axillary lymph node      dissection.  3. 2002 - colonoscopy.  4. 2003 - endometrial biopsy.  5. October 2007 - cardiac pacemaker placement.   SOCIAL HISTORY:  She has been a widow for several years.  She has two  daughters, one of whom lives in [place] and one in [place].  A  son died at age 78 years due to leukemia.  She has no history of tobacco  or alcohol abuse.  She has requested a do not resuscitate order.   FAMILY HISTORY:  Notable for some close relatives with colon cancer, but  none with diabetes mellitus or coronary artery disease.  There is no  family history of a hypercoagulable disorder.   REVIEW OF SYSTEMS:  No transfusions.  Significant for mild dyspnea on  exertion and pleuritic chest pain.  Generally, she uses a cane to  ambulate and has not had any recent falls.  She denies recent fever,  chills, cough, constipation, diarrhea, rectal bleeding, anxiety, or  depression.   INITIAL PHYSICAL EXAMINATION:  VITAL SIGNS:  Blood pressure 172/76,  pulse 59, respirations 16, temperature 98.9, pulse oxygen saturation  100% on nasal  cannula oxygen.  GENERAL:  She is an elderly white female who is in no apparent distress  while sitting upright in bed on nasal cannula oxygen.  HEENT:  Within normal limits.  NECK:  Supple and without jugular venous distension or carotid bruit.  CHEST:  Clear to auscultation.  HEART:  Regular rate and rhythm without significant murmur or gallop.  ABDOMEN:  Normal bowel sounds and no hepatosplenomegaly or tenderness.  EXTREMITIES:  Bilateral 1+ pitting edema, and the left lower extremity  was wrapped in an [employer].  NEUROLOGIC:  She was alert and well-oriented with a normal affect.  Brief exam showed no focal deficits.   LABORATORY STUDIES:  White blood cells 10.6, hemoglobin 13, hematocrit  39, platelets 180.  Fibrin degradation products 2.83 (less than 0.48).  Serum sodium 139, potassium 4.4, chloride 105, carbon dioxide 26, BUN  17, creatinine 0.98, glucose 108, troponin-I 1.82, CK 252, MB 27.6 (11).   ELECTROCARDIOGRAM:  1. Atrial paced beats.  2. Left bundle branch  block.   CHEST X-RAY:  No acute abnormalities.   CT ANGIOGRAM CHEST:  Mild cardiomegaly with basilar atelectasis.  There  were multiple emboli in the right pulmonary arteries.   IMPRESSION AND PLAN:  1. Pulmonary embolism:  Stable with the initiation of IV heparin      treatment.  We will continue heparin and start Coumadin tomorrow      for a planned 6-9 months of treatment.  At her age, a workup for      hypercoagulable conditions would be of low yield.  It is most      likely that she developed a pulmonary embolism from relative lack      of mobility due to her stasis ulcer.  It is possible that she has      recurrent breast carcinoma causing a hypercoagulable condition.      This can be further evaluated following hospital discharge.  2. Elevated troponin I level.  She may have had a non-Q-wave      myocardial infarction.  A cardiologist is following her for this      issue.  For now, we will continue IV heparin.  I have added a beta      blocker to her regimen.  Further      workup will be per the cardiology consultant.  3. Hypertension:  The systolic blood pressure was somewhat elevated,      and this should improve with the addition of Toprol XL.           ______________________________  Barry Dienes. Eloise Harman, M.D.     DGP/MEDQ  D:  07/27/2007  T:  07/27/2007  Job:  [id number]   cc:   Vesta Mixer, M.D.  Estanislado Pandy, MD  Dr. Ethelene Hal  Dr. Loreta Ave

## 2011-05-07 NOTE — Cardiovascular Report (Signed)
NAMEDORLEEN, KISSEL           ACCOUNT NO.:  [id number]   MEDICAL RECORD NO.:  [id number]          PATIENT TYPE:  OIB   LOCATION:  3713                         FACILITY:  [facility]   PHYSICIAN:  Elmore Guise., M.D.DATE OF BIRTH:  1918/08/04   DATE OF PROCEDURE:  10/18/2006  DATE OF DISCHARGE:                              CARDIAC CATHETERIZATION   PROCEDURES:  Dual-chamber permanent pacemaker implantation.   PRIMARY CARDIOLOGIST:  Vesta Mixer, M.D.   REASON FOR PROCEDURE:  Presyncope with sick sinus syndrome and history of  left bundle branch block   DESCRIPTION OF PROCEDURE:  After appropriate informed consent, the patient  brought to the catheterization lab.  She was prepped and draped in sterile  fashion.  Approximately 20 mL of 1% lidocaine was used for local anesthesia.  A 2-inch incision was made in the right deltopectoral groove.  A  subcutaneous pocket was then made with blunt and Bovie dissection.  Hemostasis was obtained before moving on with the procedure.  A venogram was  then performed showing the root of the right axillary/subclavian vein.  The  right axillary vein was then accessed in one stick. A 7-French safe sheath  was placed over retained wire.  A second wire was placed through the sheath.  The sheath was removed, and two 7-French safe sheaths were then placed at  the area.  The ventricular lead was placed with the following measurements.  The ventricular lead is a Medtronic active fixation 5076 - 52 cm, serial  number [id number].  R waves measured 17.8 mV with impedance of 1011 ohms,  threshold 0.4 volts at 0.5 milliseconds with a current of 0.5 mA; 10-volt  check was negative.  The atrial lead was then placed. The atrial lead is a  Medtronic 5076 - 45 cm, serial number [id number]. The following measurements  were obtained.  The patient did go into atrial fibrillation on placing the  ventricular lead.  Fibrillation waves measured 1.3 mV with impedance  of 658  ohms.  The 10-volt check was negative.  Three attempts were made to pace her  out of atrial fibrillation which were unsuccessful.  The atrial and  ventricular leads were then identified and placed in appropriate portions on  an in-rhythm generator. The generator serial number is [id number]. The  generator was placed into the pocket and sewn into place.  The pocket was  then closed in 3 continuous layers with 2-0 followed by 2-0 followed by 4-0  Vicryl suture.  The patient tolerated the procedure well.  No apparent  complications.  The patient will be transferred from the catheterization lab  to holding area in stable condition.      Elmore Guise., M.D.  Electronically Signed     TWK/MEDQ  D:  10/18/2006  T:  10/18/2006  Job:  [id number]   cc:   Vesta Mixer, M.D.

## 2011-05-07 NOTE — Op Note (Signed)
NAME:  Danielle Roth, Danielle Roth                     ACCOUNT NO.:  [id number]   MEDICAL RECORD NO.:  [id number]                   PATIENT TYPE:  AMB   LOCATION:  DSC                                  FACILITY:  [facility]   PHYSICIAN:  Sandria Bales. Ezzard Standing, M.D.               DATE OF BIRTH:  10/01/1918   DATE OF PROCEDURE:  02/10/2004  DATE OF DISCHARGE:                                 OPERATIVE REPORT   PREOPERATIVE DIAGNOSIS:  1 cm lesion right cheek.   POSTOPERATIVE DIAGNOSIS:  1 cm lesion right cheek.   PROCEDURE:  Excision of lesion right cheek.   SURGEON:  Sandria Bales. Ezzard Standing, M.D.   ANESTHESIA:  Approximately 3 mL of 1% Xylocaine with epinephrine.   COMPLICATIONS:  None.   INDICATIONS FOR PROCEDURE:  Ms. Barrie has had a steadily growing mole on  her right cheek which she now wishes to have excised and comes for excision  of this.   DESCRIPTION OF PROCEDURE:  The patient was placed in a supine position.  Her  right cheek was prepped with Betadine solution and sterilely draped.  The  skin was infiltrated with about 3-4 mL of 1% Xylocaine.  An elliptical  incision was made excising this raised lesion of her right cheek and this  sent to pathology.  The skin edge was elevated slightly on both sides and  closed with interrupted 5-0 nylon suture.  She will see [place] back in one week  for suture removal.                                               Sandria Bales. Ezzard Standing, M.D.    DHN/MEDQ  D:  02/10/2004  T:  02/10/2004  Job:  [id number]   cc:   Jeannett Senior A. Evlyn Kanner, M.D.  [address]  [place]  [place] [address]  Fax: [phone]   Maxie Better, M.D.  [address]  [place]  [place] [address]  Fax: [phone]

## 2011-05-07 NOTE — Procedures (Signed)
. [facility]  Patient:    Danielle Roth, Danielle Roth                    MRN: [id number] Proc. Date: 05/16/01 Adm. Date:  [id number] Attending:  Charna Elveria CC:         Tera Mater. Evlyn Kanner, M.D.   Procedure Report  DATE OF BIRTH:  1918/05/17  PROCEDURE:  Screening colonoscopy.  ENDOSCOPIST:  Anselmo Rod, M.D.  INSTRUMENT USED:  Olympus video colonoscope.  INDICATIONS:  An 77 year old white female with a history of polyps and a personal history of breast cancer.  Rule out recurrent polyps.  PREPROCEDURE PREPARATION:  An informed consent was procured from the patient. The patient was fasted for eight hours prior to the procedure and prepped with a bottle of magnesium citrate and gallon of NuLYTELY the night prior to the procedure.  PREPROCEDURE PHYSICAL EXAMINATION:  VITAL SIGNS:  Stable.  NECK:  Supple.  CHEST:  Clear to auscultation.  HEART:  S1, S2 regular.  ABDOMEN:  Soft with normal bowel sounds.  DESCRIPTION OF PROCEDURE:  The patient was placed in the left lateral decubitus position and sedated with 50 mg of Demerol and 5 mg of Versed intravenously.  Once the patient was adequately sedated and maintained on low-flow oxygen and continuous cardiac monitoring, the Olympus video colonoscope was advanced from the rectum to the cecum without difficulty.  The entire colonic mucosa appeared healthy without erosions, ulcerations, masses, or polyps.  The patient had a very tortuous colon which required change in position from the left lateral supine to the right lateral position, to facilitate adequate visualization of the colon.  IMPRESSION: 1. Healthy-appearing colonic mucosa. 2. Very tortuous colon.  No masses or polyps seen.  RECOMMENDATIONS:  Repeat colorectal cancer screening recommended in the next 10 years, unless the patient would develop any abnormal symptoms in the interim. DD:  05/16/01 TD:  05/16/01 Job:  [id number]

## 2011-05-25 ENCOUNTER — Encounter: Payer: Self-pay | Admitting: Internal Medicine

## 2011-06-18 ENCOUNTER — Ambulatory Visit (INDEPENDENT_AMBULATORY_CARE_PROVIDER_SITE_OTHER): Payer: Medicare Other | Admitting: Internal Medicine

## 2011-06-18 ENCOUNTER — Encounter: Payer: Self-pay | Admitting: Internal Medicine

## 2011-06-18 DIAGNOSIS — I4891 Unspecified atrial fibrillation: Secondary | ICD-10-CM

## 2011-06-18 DIAGNOSIS — I10 Essential (primary) hypertension: Secondary | ICD-10-CM

## 2011-06-18 DIAGNOSIS — I498 Other specified cardiac arrhythmias: Secondary | ICD-10-CM

## 2011-06-18 LAB — PACEMAKER DEVICE OBSERVATION
AL IMPEDENCE PM: 424 Ohm
ATRIAL PACING PM: 61.35
BAMS-0001: 150 {beats}/min
BATTERY VOLTAGE: 3 V
RV LEAD AMPLITUDE: 11.1 mv

## 2011-06-18 NOTE — Assessment & Plan Note (Signed)
She is appropriately anticoagulated with coumadin

## 2011-06-18 NOTE — Patient Instructions (Signed)
Your physician wants you to follow-up in: 6 months with device clinic You will receive a reminder letter in the mail two months in advance. If you don't receive a letter, please call our office to schedule the follow-up appointment.  

## 2011-06-18 NOTE — Progress Notes (Signed)
Danielle Roth is a pleasant 77 y.o. yo patient with a h/o bradycardia sp PPM (MDT) by Dr Reyes Ivan who presents today to establish care in the Electrophysiology device clinic.   The patient reports doing very well since having a pacemaker implanted and remains very active despite her age.   Today, she  denies symptoms of palpitations, chest pain, shortness of breath, orthopnea, PND, dizziness, presyncope, syncope, or neurologic sequela.   She has stable edema.  The patientis tolerating medications without difficulties and is otherwise without complaint today.   Past Medical History  Diagnosis Date  . Breast cancer     left mastectomy  . Glaucoma   . Hyperlipidemia   . Hypertension   . Stroke   . Paroxysmal atrial fibrillation     on coumadin  . Gout   . SSS (sick sinus syndrome) 2007    PPM 2007 by Dr. Reyes Ivan  . Pulmonary embolus     on coumadin    Past Surgical History  Procedure Date  . Mastectomy     left  . Tonsillectomy   . Pacemaker insertion 2007    dual-chamber permanent pacemaker implantation by Dr Reyes Ivan MDT    History   Social History  . Marital Status: Widowed    Spouse Name: N/A    Number of Children: N/A  . Years of Education: N/A   Occupational History  . retired    Social History Main Topics  . Smoking status: Never Smoker   . Smokeless tobacco: Not on file  . Alcohol Use: No  . Drug Use: No  . Sexually Active: Not on file   Other Topics Concern  . Not on file   Social History Narrative   Patient lives in assisted living in [place] at [facility].   Retired Comptroller.Previously expressed wishes for DNI/DNR    Family History  Problem Relation Age of Onset  . Heart disease Mother   . Kidney disease Mother   . Heart disease Father   . Colon cancer Neg Hx     Allergies  Allergen Reactions  . Codeine   . Iodine   . Sulfonamide Derivatives   . Voltaren     Current Outpatient Prescriptions  Medication Sig Dispense Refill  .  allopurinol (ZYLOPRIM) 300 MG tablet Take 300 mg by mouth daily.        . benazepril (LOTENSIN) 20 MG tablet Take 20 mg by mouth daily.        . bimatoprost (LUMIGAN) 0.03 % ophthalmic solution 1 drop at bedtime.        . Calcium Carbonate-Vitamin D (CALTRATE 600+D) 600-400 MG-UNIT per tablet Take 1 tablet by mouth 2 (two) times daily.        . fluticasone (FLONASE) 50 MCG/ACT nasal spray Place 2 sprays into the nose daily.        Marland Kitchen levETIRAcetam (KEPPRA) 250 MG tablet Take 250 mg by mouth every 12 (twelve) hours.       . Multiple Vitamin (MULTIVITAMIN) tablet Take 1 tablet by mouth daily.        . nitroGLYCERIN (NITROSTAT) 0.4 MG SL tablet Place 0.4 mg under the tongue every 5 (five) minutes as needed.        Marland Kitchen omeprazole (PRILOSEC) 20 MG capsule Take 20 mg by mouth daily.        Bertram Gala Glycol-Propyl Glycol (SYSTANE) 0.4-0.3 % SOLN Apply to eye as needed.        . Polyethylene Glycol 3350 (  GLYCOLAX PO) Take by mouth as needed.        . pravastatin (PRAVACHOL) 40 MG tablet Take 40 mg by mouth daily.        . Timolol Maleate 0.5 % (DAILY) SOLN Apply to eye 2 (two) times daily.        . Trospium Chloride 60 MG CP24 Take 60 mg by mouth daily.        Marland Kitchen warfarin (COUMADIN) 3 MG tablet Take 3 mg by mouth. As directed by Anticoagulation Clinic         ROS- all systems are reviewed and negative except as per HPI  Physical Exam: Filed Vitals:   06/18/11 1205  BP: 126/59  Pulse: 61  Height: 5\' 5"  (1.651 m)  Weight: 186 lb (84.369 kg)    GEN- The patient is well appearing, alert and oriented x 3 today.   Head- normocephalic, atraumatic Eyes-  Sclera clear, conjunctiva pink Ears- hearing intact Oropharynx- clear Neck- supple, no JVP Lymph- no cervical lymphadenopathy Lungs- Clear to ausculation bilaterally, normal work of breathing Chest- pacemaker pocket is well healed Heart- Regular rate and rhythm, no murmurs, rubs or gallops, PMI not laterally displaced GI- soft, NT, ND, +  BS Extremities- no clubbing, cyanosis, +2 edema MS- no significant deformity or atrophy Skin- no rash or lesion Psych- euthymic mood, full affect Neuro- strength and sensation are intact  Pacemaker interrogation- reviewed in detail today,  See PACEART report  Assessment and Plan:

## 2011-06-18 NOTE — Assessment & Plan Note (Signed)
Stable No change required today  

## 2011-06-18 NOTE — Assessment & Plan Note (Signed)
Normal pacemaker function See Pace Art report No changes today  

## 2011-10-04 LAB — PROTIME-INR
INR: 1
INR: 1.4
INR: 2 — ABNORMAL HIGH
INR: 2 — ABNORMAL HIGH
Prothrombin Time: 13.3
Prothrombin Time: 23.4 — ABNORMAL HIGH

## 2011-10-04 LAB — DIFFERENTIAL
Basophils Absolute: 0
Basophils Relative: 0
Eosinophils Absolute: 0.2
Eosinophils Relative: 2
Lymphocytes Relative: 22
Lymphs Abs: 2.4
Monocytes Absolute: 0.9 — ABNORMAL HIGH
Monocytes Relative: 8
Neutro Abs: 7.1
Neutrophils Relative %: 67

## 2011-10-04 LAB — CBC
HCT: 35.8 — ABNORMAL LOW
HCT: 36.5
HCT: 37.8
HCT: 39.8
Hemoglobin: 12.2
Hemoglobin: 12.5
Hemoglobin: 13.8
MCHC: 33.9
MCHC: 34.4
MCHC: 34.6
MCV: 89.3
MCV: 90.6
MCV: 90.8
Platelets: 180
Platelets: 189
RBC: 3.94
RBC: 4.05
RBC: 4.17
RBC: 4.45
RDW: 13.4
RDW: 13.5
WBC: 10.6 — ABNORMAL HIGH
WBC: 11 — ABNORMAL HIGH
WBC: 7.1
WBC: 7.2
WBC: 9.2

## 2011-10-04 LAB — BASIC METABOLIC PANEL WITH GFR
BUN: 17
CO2: 26
Chloride: 105
Glucose, Bld: 108 — ABNORMAL HIGH
Potassium: 4.4

## 2011-10-04 LAB — D-DIMER, QUANTITATIVE: D-Dimer, Quant: 2.83 — ABNORMAL HIGH

## 2011-10-04 LAB — APTT: aPTT: 26

## 2011-10-04 LAB — BASIC METABOLIC PANEL
CO2: 27
Calcium: 10.5
Chloride: 108
Creatinine, Ser: 0.98
GFR calc Af Amer: >60
GFR calc Af Amer: >60
GFR calc non Af Amer: 54 — ABNORMAL LOW
Glucose, Bld: 79
Potassium: 3.8
Sodium: 139
Sodium: 139

## 2011-10-04 LAB — TROPONIN I: Troponin I: 1.82

## 2011-10-04 LAB — CK TOTAL AND CKMB (NOT AT ARMC)
CK, MB: 27.6 — ABNORMAL HIGH
Relative Index: 11 — ABNORMAL HIGH
Total CK: 252 — ABNORMAL HIGH

## 2011-10-12 NOTE — H&P (Signed)
  NAME:  Danielle Roth, Danielle Roth           ACCOUNT NO.:  [id number]  MEDICAL RECORD NO.:  [id number]          PATIENT TYPE:  REC  LOCATION:  FOOT                         FACILITY:  [facility]  PHYSICIAN:  Joanne Gavel, M.D.        DATE OF BIRTH:  [birth year]  DATE OF ADMISSION: DATE OF DISCHARGE:                             HISTORY & PHYSICAL   CHIEF COMPLAINT:  Wound, left toe and right anterior leg.  HISTORY OF PRESENT ILLNESS:  This is a 77 year old female with a traumatic wound which has not healed on the dorsum of her left second toe and a brand new wound caused by a trauma of the right foreleg.  PAST MEDICAL HISTORY:  Significant for: 1. Osteoporosis. 2. Hypertension. 3. Hyperlipidemia. 4. Glaucoma. 5. Goiter. 6. Remote history of breast cancer. 7. Gout. 8. Pacemaker.  PAST SURGICAL HISTORY: 1. Colon polyp in 2002. 2. Left modified radical mastectomy in 1993. 3. Cardiac cath in 2002. 4. Pacemaker in 2007. 5. Tonsillectomy. 6. D and C. 7. 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  CIGARETTES:  None.  ALCOHOL:  None.  MEDICATIONS:  Timolol and Lumigan eye drops, benazepril, pravastatin, omeprazole, Coumadin, nitroglycerin, [place] XR, allopurinol.  PHYSICAL EXAMINATION:  VITAL SIGNS:  Temperature 98.9, pulse 63, respirations 20, blood pressure 114/64. EYES, EARS, NOSE, THROAT:  Normal. NECK:  Supple. CHEST:  Clear. HEART:  Regular rhythm at this time. EXTREMITIES:  Bilateral pretibial edema.  There is a 0.4 X 0.4 clean wound on the anterior foreleg on the right side.  There is on the dorsum of the toe which is slightly deformed possibly by gouty tophus, a 0.3 x 0.4 x 0.2 wound also claimed.  I believe that I can palpate dorsalis pedis pulses bilaterally, but they are relatively weak.  ABI is 0.9 and 0.8, right greater than left.  There is good skin nutrition generally and no sign of neuropathy, loss of sensation or movement.  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  PLAN:  As both wounds are clean, there was no treatment with Silver collagen.  I have also ordered vascular studies to make sure that the arterial inflow is adequately healing.     Joanne Gavel, M.D.     RA/MEDQ  D:  01/12/2011  T:  01/13/2011  Job:  [id number]  cc:   Samul Dada, M.D. Tera Mater. Evlyn Kanner, M.D. Sandria Bales. Ezzard Standing, M.D. Richard D. Ethelene Hal, M.D.  Electronically Signed by Joanne Gavel M.D. on 10/12/2011 08:47:57 AM

## 2012-01-20 ENCOUNTER — Encounter: Payer: Medicare Other | Admitting: Internal Medicine

## 2012-02-03 ENCOUNTER — Encounter: Payer: Self-pay | Admitting: Internal Medicine

## 2012-02-03 ENCOUNTER — Ambulatory Visit (INDEPENDENT_AMBULATORY_CARE_PROVIDER_SITE_OTHER): Payer: Medicare Other | Admitting: Internal Medicine

## 2012-02-03 DIAGNOSIS — I4891 Unspecified atrial fibrillation: Secondary | ICD-10-CM

## 2012-02-03 DIAGNOSIS — I498 Other specified cardiac arrhythmias: Secondary | ICD-10-CM

## 2012-02-03 LAB — PACEMAKER DEVICE OBSERVATION
AL IMPEDENCE PM: 408 Ohm
AL THRESHOLD: 1 V
RV LEAD AMPLITUDE: 11.447 mv
RV LEAD IMPEDENCE PM: 616 Ohm

## 2012-02-03 NOTE — Patient Instructions (Signed)
Your physician wants you to follow-up in: 6 months with the device clinic and 12 months with Dr Allred You will receive a reminder letter in the mail two months in advance. If you don't receive a letter, please call our office to schedule the follow-up appointment.  

## 2012-02-03 NOTE — Assessment & Plan Note (Signed)
Normal pacemaker function See Pace Art report No changes today  

## 2012-02-03 NOTE — Progress Notes (Signed)
PCP:  Julian Hy, MD, MD  The patient presents today for routine electrophysiology followup.  Since last being seen in our clinic, the patient reports doing very well.  Today, she denies symptoms of palpitations, chest pain, shortness of breath, orthopnea, PND, lower extremity edema, dizziness, presyncope, syncope, or neurologic sequela.  The patient feels that she is tolerating medications without difficulties and is otherwise without complaint today.   Past Medical History  Diagnosis Date  . Breast cancer     left mastectomy  . Glaucoma   . Hyperlipidemia   . Hypertension   . Stroke   . Paroxysmal atrial fibrillation     on coumadin  . Gout   . SSS (sick sinus syndrome) 2007    PPM 2007 by Dr. Reyes Ivan  . Pulmonary embolus     on coumadin   Past Surgical History  Procedure Date  . Mastectomy     left  . Tonsillectomy   . Pacemaker insertion 2007    dual-chamber permanent pacemaker implantation by Dr Reyes Ivan MDT    Current Outpatient Prescriptions  Medication Sig Dispense Refill  . allopurinol (ZYLOPRIM) 300 MG tablet Take 300 mg by mouth daily.        . benazepril (LOTENSIN) 20 MG tablet Take 20 mg by mouth daily.        . bimatoprost (LUMIGAN) 0.03 % ophthalmic solution 1 drop at bedtime.        . Calcium Carbonate-Vitamin D (CALTRATE 600+D) 600-400 MG-UNIT per tablet Take 1 tablet by mouth 2 (two) times daily.        . fluticasone (FLONASE) 50 MCG/ACT nasal spray Place 2 sprays into the nose daily.        Marland Kitchen levETIRAcetam (KEPPRA) 250 MG tablet Take 250 mg by mouth every 12 (twelve) hours.       . Multiple Vitamin (MULTIVITAMIN) tablet Take 1 tablet by mouth daily.        . nitroGLYCERIN (NITROSTAT) 0.4 MG SL tablet Place 0.4 mg under the tongue every 5 (five) minutes as needed.        Marland Kitchen omeprazole (PRILOSEC) 20 MG capsule Take 20 mg by mouth daily.        Bertram Gala Glycol-Propyl Glycol (SYSTANE) 0.4-0.3 % SOLN Apply to eye as needed.        . Polyethylene Glycol  3350 (GLYCOLAX PO) Take by mouth as needed.        . pravastatin (PRAVACHOL) 40 MG tablet Take 40 mg by mouth daily.        . Timolol Maleate 0.5 % (DAILY) SOLN Apply to eye 2 (two) times daily.        . Trospium Chloride 60 MG CP24 Take 60 mg by mouth daily.        Marland Kitchen warfarin (COUMADIN) 3 MG tablet Take 3 mg by mouth. As directed by Anticoagulation Clinic         Allergies  Allergen Reactions  . Codeine   . Iodine   . Sulfonamide Derivatives   . Voltaren     History   Social History  . Marital Status: Widowed    Spouse Name: N/A    Number of Children: N/A  . Years of Education: N/A   Occupational History  . retired    Social History Main Topics  . Smoking status: Never Smoker   . Smokeless tobacco: Not on file  . Alcohol Use: No  . Drug Use: No  . Sexually Active: Not on file  Other Topics Concern  . Not on file   Social History Narrative   Patient lives in assisted living in [place] at [facility].   Retired Comptroller.Previously expressed wishes for DNI/DNR    Family History  Problem Relation Age of Onset  . Heart disease Mother   . Kidney disease Mother   . Heart disease Father   . Colon cancer Neg Hx    Physical Exam: Filed Vitals:   02/03/12 1436  BP: 116/54  Pulse: 61  Weight: 177 lb 12.8 oz (80.65 kg)    GEN- The patient is well appearing, alert and oriented x 3 today.   Head- normocephalic, atraumatic Eyes-  Sclera clear, conjunctiva pink Ears- hearing intact Oropharynx- clear Neck- supple, no JVP Lymph- no cervical lymphadenopathy Lungs- Clear to ausculation bilaterally, normal work of breathing Chest- pacemaker pocket is well healed Heart- Regular rate and rhythm, no murmurs, rubs or gallops, PMI not laterally displaced GI- soft, NT, ND, + BS Extremities- no clubbing, cyanosis, or edema  Pacemaker interrogation- reviewed in detail today,  See PACEART report  Assessment and Plan:

## 2012-02-03 NOTE — Assessment & Plan Note (Signed)
She is appropriately anticoagulated with coumadin 

## 2012-04-11 ENCOUNTER — Other Ambulatory Visit: Payer: Self-pay | Admitting: Radiology

## 2012-08-16 ENCOUNTER — Encounter: Payer: Self-pay | Admitting: Cardiology

## 2012-08-16 ENCOUNTER — Ambulatory Visit (INDEPENDENT_AMBULATORY_CARE_PROVIDER_SITE_OTHER): Payer: Medicare Other | Admitting: Cardiology

## 2012-08-16 DIAGNOSIS — Z950 Presence of cardiac pacemaker: Secondary | ICD-10-CM

## 2012-08-16 DIAGNOSIS — I495 Sick sinus syndrome: Secondary | ICD-10-CM

## 2012-08-16 LAB — PACEMAKER DEVICE OBSERVATION
AL IMPEDENCE PM: 408 Ohm
ATRIAL PACING PM: 52.3
RV LEAD IMPEDENCE PM: 688 Ohm
VENTRICULAR PACING PM: 0.1

## 2012-08-16 NOTE — Progress Notes (Signed)
Device check only. See PaceArt report. 

## 2012-08-24 ENCOUNTER — Encounter: Payer: Self-pay | Admitting: Internal Medicine

## 2012-09-09 ENCOUNTER — Emergency Department (HOSPITAL_BASED_OUTPATIENT_CLINIC_OR_DEPARTMENT_OTHER)
Admission: EM | Admit: 2012-09-09 | Discharge: 2012-09-09 | Disposition: A | Discharge status: Skilled Nursing Facility | Payer: Medicare Other | Attending: Emergency Medicine | Admitting: Emergency Medicine

## 2012-09-09 ENCOUNTER — Encounter (HOSPITAL_BASED_OUTPATIENT_CLINIC_OR_DEPARTMENT_OTHER): Payer: Self-pay | Admitting: Emergency Medicine

## 2012-09-09 ENCOUNTER — Emergency Department (HOSPITAL_BASED_OUTPATIENT_CLINIC_OR_DEPARTMENT_OTHER): Payer: Medicare Other

## 2012-09-09 DIAGNOSIS — M25559 Pain in unspecified hip: Secondary | ICD-10-CM | POA: Insufficient documentation

## 2012-09-09 DIAGNOSIS — Y9301 Activity, walking, marching and hiking: Secondary | ICD-10-CM | POA: Insufficient documentation

## 2012-09-09 DIAGNOSIS — Z8673 Personal history of transient ischemic attack (TIA), and cerebral infarction without residual deficits: Secondary | ICD-10-CM | POA: Insufficient documentation

## 2012-09-09 DIAGNOSIS — M25519 Pain in unspecified shoulder: Secondary | ICD-10-CM | POA: Insufficient documentation

## 2012-09-09 DIAGNOSIS — Z7901 Long term (current) use of anticoagulants: Secondary | ICD-10-CM | POA: Insufficient documentation

## 2012-09-09 DIAGNOSIS — W010XXA Fall on same level from slipping, tripping and stumbling without subsequent striking against object, initial encounter: Secondary | ICD-10-CM | POA: Insufficient documentation

## 2012-09-09 DIAGNOSIS — Z79899 Other long term (current) drug therapy: Secondary | ICD-10-CM | POA: Insufficient documentation

## 2012-09-09 DIAGNOSIS — Y9210 Unspecified residential institution as the place of occurrence of the external cause: Secondary | ICD-10-CM | POA: Insufficient documentation

## 2012-09-09 DIAGNOSIS — M542 Cervicalgia: Secondary | ICD-10-CM | POA: Insufficient documentation

## 2012-09-09 DIAGNOSIS — S0003XA Contusion of scalp, initial encounter: Secondary | ICD-10-CM | POA: Insufficient documentation

## 2012-09-09 DIAGNOSIS — M25512 Pain in left shoulder: Secondary | ICD-10-CM

## 2012-09-09 NOTE — ED Provider Notes (Signed)
History     CSN: [id number]  Arrival date & time 09/09/12  0520   First MD Initiated Contact with Patient 09/09/12 [phone]      Chief Complaint  Patient presents with  . Fall    (Consider location/radiation/quality/duration/timing/severity/associated sxs/prior treatment) HPI This is a 78 year old white female who was walking with her walker at her living facility this morning. She states her walker skidded out away from her and she fell onto her left side. She is complaining of moderate to severe pain in the left shoulder, worse with movement or palpation. There is a wound to her occiput that has been bleeding. There was no loss of consciousness and she recalls the fall. She denies neck or back pain. She was fully spinally immobilized prior to transport.  Past Medical History  Diagnosis Date  . Breast cancer     left mastectomy  . Glaucoma   . Hyperlipidemia   . Hypertension   . Stroke   . Paroxysmal atrial fibrillation     on coumadin  . Gout   . SSS (sick sinus syndrome) 2007    PPM 2007 by Dr. Reyes Ivan  . Pulmonary embolus     on coumadin    Past Surgical History  Procedure Date  . Mastectomy     left  . Tonsillectomy   . Pacemaker insertion 2007    dual-chamber permanent pacemaker implantation by Dr Reyes Ivan MDT    Family History  Problem Relation Age of Onset  . Heart disease Mother   . Kidney disease Mother   . Heart disease Father   . Colon cancer Neg Hx     History  Substance Use Topics  . Smoking status: Never Smoker   . Smokeless tobacco: Never Used  . Alcohol Use: No    OB History    Grav Para Term Preterm Abortions TAB SAB Ect Mult Living                  Review of Systems  All other systems reviewed and are negative.    Allergies  Codeine; Diclofenac sodium; Iodine; Other; and Sulfonamide derivatives  Home Medications   Current Outpatient Rx  Name Route Sig Dispense Refill  . ALLOPURINOL 300 MG PO TABS Oral Take 300 mg by mouth daily.       Marland Kitchen BENAZEPRIL HCL 20 MG PO TABS Oral Take 20 mg by mouth daily.      Marland Kitchen BIMATOPROST 0.03 % OP SOLN  1 drop at bedtime.      Marland Kitchen CALCIUM CARBONATE-VITAMIN D 600-400 MG-UNIT PO TABS Oral Take 1 tablet by mouth 2 (two) times daily.      Marland Kitchen FLUTICASONE PROPIONATE 50 MCG/ACT NA SUSP Nasal Place 2 sprays into the nose daily.      Marland Kitchen LEVETIRACETAM 250 MG PO TABS Oral Take 250 mg by mouth every 12 (twelve) hours.     Marland Kitchen ONE-DAILY MULTI VITAMINS PO TABS Oral Take 1 tablet by mouth daily.      Marland Kitchen NITROGLYCERIN 0.4 MG SL SUBL Sublingual Place 0.4 mg under the tongue every 5 (five) minutes as needed.      Marland Kitchen OMEPRAZOLE 20 MG PO CPDR Oral Take 20 mg by mouth daily.      Marland Kitchen POLYETHYL GLYCOL-PROPYL GLYCOL 0.4-0.3 % OP SOLN Ophthalmic Apply to eye as needed.      Marland Kitchen GLYCOLAX PO Oral Take by mouth as needed.      Marland Kitchen PRAVASTATIN SODIUM 40 MG PO TABS Oral Take 40  mg by mouth daily.      Marland Kitchen TIMOLOL MALEATE 0.5 % (DAILY) OP SOLN Ophthalmic Apply to eye 2 (two) times daily.      . TROSPIUM CHLORIDE ER 60 MG PO CP24 Oral Take 60 mg by mouth daily.      . WARFARIN SODIUM 3 MG PO TABS Oral Take 3 mg by mouth. As directed by Anticoagulation Clinic       BP 157/72  Pulse 92  Temp 97.6 F (36.4 C) (Oral)  Resp 16  SpO2 100%  Physical Exam General: Well-developed, well-nourished female in no acute distress; appearance consistent with age of record; immobilized on spine board HENT: normocephalic; scalp hematoma left occiput with overlying superficial abrasion Eyes: pupils equal round and reactive to light; extraocular muscles intact; senilis bilateral Neck: supple; mild tenderness on palpation of C-spine Heart: regular rate and rhythm; distant sounds Lungs: clear to auscultation bilaterally Chest: Nontender Abdomen: soft; nondistended; nontender Extremities: No deformity; tenderness and decreased range of motion left shoulder with left upper extremity neurovascularly intact distally; edema of lower legs with wrappings in  place; pain in hips on passive range of motion of lower extremities Neurologic: Awake, alert; motor function intact in all extremities and symmetric; no facial droop Skin: Warm and dry Psychiatric: Normal mood and affect    ED Course  Procedures (including critical care time)     MDM   Nursing notes and vitals signs, including pulse oximetry, reviewed.  Summary of this visit's results, reviewed by myself:  Imaging Studies: Dg Hip Complete Left  09/09/2012  *RADIOLOGY REPORT*  Clinical Data: Status post fall  LEFT HIP - COMPLETE 2+ VIEW  Comparison: None.  Findings: Both hips appear located.  There is no acute fracture or subluxation identified.  Mild bilateral hip osteoarthritis is noted.  No radiopaque foreign body or soft tissue calcifications identified.  IMPRESSION:  1.  No acute findings noted.   Original Report Authenticated By: Rosealee Albee, M.D.    Dg Hip Complete Right  09/09/2012  *RADIOLOGY REPORT*  Clinical Data: Status post fall  RIGHT HIP - COMPLETE 2+ VIEW  Comparison: None.  Findings: There is no evidence of fracture or dislocation. Mild osteoarthritis is identified.  Soft tissues are unremarkable.  IMPRESSION:  1.  No acute findings. 2.  Mild osteoarthritis.   Original Report Authenticated By: Rosealee Albee, M.D.    Ct Head Wo Contrast  09/09/2012  *RADIOLOGY REPORT*  Clinical Data:  Breast cancer.  Fall.  CT HEAD WITHOUT CONTRAST CT CERVICAL SPINE WITHOUT CONTRAST  Technique:  Multidetector CT imaging of the head and cervical spine was performed following the standard protocol without intravenous contrast.  Multiplanar CT image reconstructions of the cervical spine were also generated.  Comparison:  09/24/2006  CT HEAD  Findings: There is diffuse patchy low density throughout the subcortical and periventricular white matter consistent with chronic small vessel ischemic change.  There is prominence of the sulci and ventricles consistent with brain atrophy.  There is  no evidence for acute brain infarct, hemorrhage or mass.  The paranasal sinuses are clear.  The mastoid air cells are clear.  The skull appears intact.  IMPRESSION:  1.  No acute intracranial abnormalities. 2.  Small vessel ischemic change and brain atrophy.  CT CERVICAL SPINE  Findings: Normal alignment of the cervical spine.  The prevertebral soft tissue space appears normal.  Facet joints appear well aligned.  There is multilevel disc space narrowing and ventral endplate spurring consistent with  degenerative disc disease.  No fractures identified.  The lung apices appear clear.  IMPRESSION:  1.  No acute findings. 2.  Cervical spondylosis noted.   Original Report Authenticated By: Rosealee Albee, M.D.    Ct Cervical Spine Wo Contrast  09/09/2012  *RADIOLOGY REPORT*  Clinical Data:  Breast cancer.  Fall.  CT HEAD WITHOUT CONTRAST CT CERVICAL SPINE WITHOUT CONTRAST  Technique:  Multidetector CT imaging of the head and cervical spine was performed following the standard protocol without intravenous contrast.  Multiplanar CT image reconstructions of the cervical spine were also generated.  Comparison:  09/24/2006  CT HEAD  Findings: There is diffuse patchy low density throughout the subcortical and periventricular white matter consistent with chronic small vessel ischemic change.  There is prominence of the sulci and ventricles consistent with brain atrophy.  There is no evidence for acute brain infarct, hemorrhage or mass.  The paranasal sinuses are clear.  The mastoid air cells are clear.  The skull appears intact.  IMPRESSION:  1.  No acute intracranial abnormalities. 2.  Small vessel ischemic change and brain atrophy.  CT CERVICAL SPINE  Findings: Normal alignment of the cervical spine.  The prevertebral soft tissue space appears normal.  Facet joints appear well aligned.  There is multilevel disc space narrowing and ventral endplate spurring consistent with degenerative disc disease.  No fractures  identified.  The lung apices appear clear.  IMPRESSION:  1.  No acute findings. 2.  Cervical spondylosis noted.   Original Report Authenticated By: Rosealee Albee, M.D.    Dg Shoulder Left  09/09/2012  *RADIOLOGY REPORT*  Clinical Data: Status post fall  LEFT SHOULDER - 2+ VIEW  Comparison: None.  Findings: Mild degenerative changes are noted involving the acromioclavicular and glenohumeral joints.  There is no fracture or subluxation identified.  Surgical clips noted in the left axilla.  IMPRESSION: 1.  No acute findings. 2.  Degenerative change.   Original Report Authenticated By: Rosealee Albee, M.D.     6:32 AM The patient continues to have pain in the left shoulder and she normally does not use her left arm. She controls her walker with her right arm and lesion left arm pain. This is due to chronic lymphedematous changes status post mastectomy on the left. She states she would appreciate having a sling to left arm. She does not wish any analgesic medication.       Hanley Seamen, MD 09/09/12 [phone]

## 2012-09-09 NOTE — ED Notes (Signed)
Fall from standing position with c/o left arm pain.

## 2013-02-05 ENCOUNTER — Encounter: Payer: Self-pay | Admitting: Internal Medicine

## 2013-02-05 ENCOUNTER — Ambulatory Visit (INDEPENDENT_AMBULATORY_CARE_PROVIDER_SITE_OTHER): Payer: Medicare Other | Admitting: Internal Medicine

## 2013-02-05 VITALS — BP 119/58 | HR 61 | Ht 66.0 in | Wt 164.0 lb

## 2013-02-05 DIAGNOSIS — I4891 Unspecified atrial fibrillation: Secondary | ICD-10-CM

## 2013-02-05 DIAGNOSIS — I498 Other specified cardiac arrhythmias: Secondary | ICD-10-CM

## 2013-02-05 DIAGNOSIS — I10 Essential (primary) hypertension: Secondary | ICD-10-CM

## 2013-02-05 LAB — PACEMAKER DEVICE OBSERVATION
AL IMPEDENCE PM: 320 Ohm
AL THRESHOLD: 1 V
ATRIAL PACING PM: 20.6
RV LEAD AMPLITUDE: 11.1 mv
RV LEAD IMPEDENCE PM: 536 Ohm
RV LEAD THRESHOLD: 0.5 V

## 2013-02-05 NOTE — Assessment & Plan Note (Signed)
Afib burden 1% Atrial sensitivity adjusted due to undersensing today

## 2013-02-05 NOTE — Assessment & Plan Note (Signed)
Normal pacemaker function See Pace Art report No changes today  

## 2013-02-05 NOTE — Progress Notes (Signed)
PCP:  Julian Hy, MD  The patient presents today for routine electrophysiology followup.  Since last being seen in our clinic, the patient reports doing very well.  Today, she denies symptoms of palpitations, chest pain, shortness of breath, orthopnea, PND, lower extremity edema, dizziness, presyncope, syncope, or neurologic sequela.  The patient feels that she is tolerating medications without difficulties and is otherwise without complaint today.   Past Medical History  Diagnosis Date  . Breast cancer     left mastectomy  . Glaucoma(365)   . Hyperlipidemia   . Hypertension   . Stroke   . Paroxysmal atrial fibrillation     on coumadin  . Gout   . SSS (sick sinus syndrome) 2007    PPM 2007 by Dr. Reyes Ivan  . Pulmonary embolus     on coumadin   Past Surgical History  Procedure Laterality Date  . Mastectomy      left  . Tonsillectomy    . Pacemaker insertion  2007    dual-chamber permanent pacemaker implantation by Dr Reyes Ivan MDT    Current Outpatient Prescriptions  Medication Sig Dispense Refill  . allopurinol (ZYLOPRIM) 300 MG tablet Take 300 mg by mouth daily.        . benazepril (LOTENSIN) 20 MG tablet Take 20 mg by mouth daily.        . bimatoprost (LUMIGAN) 0.03 % ophthalmic solution 1 drop at bedtime.        . fluticasone (FLONASE) 50 MCG/ACT nasal spray Place 2 sprays into the nose daily.        Marland Kitchen levETIRAcetam (KEPPRA) 250 MG tablet Take 250 mg by mouth every 12 (twelve) hours.       . Multiple Vitamin (MULTIVITAMIN) tablet Take 1 tablet by mouth daily.        . nitroGLYCERIN (NITROSTAT) 0.4 MG SL tablet Place 0.4 mg under the tongue every 5 (five) minutes as needed.        Marland Kitchen omeprazole (PRILOSEC) 20 MG capsule Take 20 mg by mouth daily.        Bertram Gala Glycol-Propyl Glycol (SYSTANE) 0.4-0.3 % SOLN Apply to eye as needed.        . Polyethylene Glycol 3350 (GLYCOLAX PO) Take by mouth as needed.        . pravastatin (PRAVACHOL) 40 MG tablet Take 40 mg by mouth  daily.        . Timolol Maleate 0.5 % (DAILY) SOLN Apply to eye 2 (two) times daily.        . Trospium Chloride 60 MG CP24 Take 60 mg by mouth daily.        Marland Kitchen warfarin (COUMADIN) 3 MG tablet Take 3 mg by mouth. As directed by Anticoagulation Clinic       . Calcium Carbonate-Vitamin D (CALTRATE 600+D) 600-400 MG-UNIT per tablet Take 1 tablet by mouth 2 (two) times daily.         No current facility-administered medications for this visit.    Allergies  Allergen Reactions  . Codeine   . Diclofenac Sodium   . Iodine   . Other Nausea And Vomiting    Walnuts  . Sulfonamide Derivatives     History   Social History  . Marital Status: Widowed    Spouse Name: N/A    Number of Children: N/A  . Years of Education: N/A   Occupational History  . retired    Social History Main Topics  . Smoking status: Never Smoker   . Smokeless  tobacco: Never Used  . Alcohol Use: No  . Drug Use: No  . Sexually Active: Not on file   Other Topics Concern  . Not on file   Social History Narrative   Patient lives in assisted living in [place] at [facility].   Retired Comptroller.   Previously expressed wishes for DNI/DNR    Family History  Problem Relation Age of Onset  . Heart disease Mother   . Kidney disease Mother   . Heart disease Father   . Colon cancer Neg Hx    Physical Exam: Filed Vitals:   02/05/13 1429  BP: 119/58  Pulse: 61  Height: 5\' 6"  (1.676 m)  Weight: 164 lb (74.39 kg)    GEN- The patient is well appearing, alert and oriented x 3 today.   Head- normocephalic, atraumatic Eyes-  Sclera clear, conjunctiva pink Ears- hearing intact Oropharynx- clear Neck- supple, no JVP Lymph- no cervical lymphadenopathy Lungs- Clear to ausculation bilaterally, normal work of breathing Chest- pacemaker pocket is well healed Heart- Regular rate and rhythm, no murmurs, rubs or gallops, PMI not laterally displaced GI- soft, NT, ND, + BS Extremities- no clubbing, cyanosis, or  edema  Pacemaker interrogation- reviewed in detail today,  See PACEART report  Assessment and Plan:

## 2013-02-05 NOTE — Assessment & Plan Note (Signed)
Stable No change required today  

## 2013-02-05 NOTE — Patient Instructions (Signed)
Your physician wants you to follow-up in: 6 months with device clinic and 12 months with Dr Allred You will receive a reminder letter in the mail two months in advance. If you don't receive a letter, please call our office to schedule the follow-up appointment.  

## 2013-03-02 ENCOUNTER — Other Ambulatory Visit: Payer: Self-pay | Admitting: Endocrinology

## 2013-03-02 DIAGNOSIS — H547 Unspecified visual loss: Secondary | ICD-10-CM

## 2013-03-07 ENCOUNTER — Ambulatory Visit
Admission: RE | Admit: 2013-03-07 | Discharge: 2013-03-07 | Disposition: A | Discharge status: Home / Self Care | Payer: Medicare Other | Source: Ambulatory Visit | Attending: Endocrinology | Admitting: Endocrinology

## 2013-04-20 ENCOUNTER — Telehealth: Payer: Self-pay | Admitting: Internal Medicine

## 2013-04-20 NOTE — Telephone Encounter (Signed)
Daughter states that he mother had some diarrhea last week and a little this week. States they altered her diet and gave her imodium and she did ok. Pt feels it is important for her to take the miralax and the daughter had questions about starting this up again. Discussed with daughter that they could restart the miralax and take it every other day and see how she tolerated it. Instructed them to call back if she needed to.

## 2013-06-07 ENCOUNTER — Encounter: Payer: Self-pay | Admitting: Internal Medicine

## 2013-07-26 ENCOUNTER — Encounter: Payer: Self-pay | Admitting: Cardiology

## 2013-07-28 ENCOUNTER — Other Ambulatory Visit: Payer: Self-pay | Admitting: Neurology

## 2013-07-31 ENCOUNTER — Encounter: Payer: Self-pay | Admitting: Cardiology

## 2013-08-07 ENCOUNTER — Encounter: Payer: Medicare Other | Admitting: Cardiology

## 2013-08-17 ENCOUNTER — Encounter: Payer: Medicare Other | Admitting: Cardiology

## 2013-08-24 ENCOUNTER — Encounter: Payer: Medicare Other | Admitting: Cardiology

## 2013-09-03 ENCOUNTER — Other Ambulatory Visit: Payer: Self-pay | Admitting: Neurology

## 2013-09-25 ENCOUNTER — Encounter: Payer: Self-pay | Admitting: Internal Medicine

## 2013-10-02 ENCOUNTER — Encounter: Payer: Self-pay | Admitting: Cardiology

## 2013-10-02 ENCOUNTER — Encounter: Payer: Medicare Other | Admitting: Cardiology

## 2013-10-02 ENCOUNTER — Encounter: Payer: Self-pay | Admitting: Internal Medicine

## 2013-10-02 ENCOUNTER — Ambulatory Visit (INDEPENDENT_AMBULATORY_CARE_PROVIDER_SITE_OTHER): Payer: Medicare Other | Admitting: Cardiology

## 2013-10-02 VITALS — BP 118/58 | HR 60 | Ht 66.0 in | Wt 149.0 lb

## 2013-10-02 DIAGNOSIS — I4891 Unspecified atrial fibrillation: Secondary | ICD-10-CM

## 2013-10-02 DIAGNOSIS — Z950 Presence of cardiac pacemaker: Secondary | ICD-10-CM

## 2013-10-02 DIAGNOSIS — I495 Sick sinus syndrome: Secondary | ICD-10-CM

## 2013-10-02 DIAGNOSIS — I10 Essential (primary) hypertension: Secondary | ICD-10-CM

## 2013-10-02 LAB — PACEMAKER DEVICE OBSERVATION
AL AMPLITUDE: 3.2 mv
AL IMPEDENCE PM: 344 Ohm
ATRIAL PACING PM: 56
RV LEAD IMPEDENCE PM: 504 Ohm
RV LEAD THRESHOLD: 1 V
VENTRICULAR PACING PM: 0

## 2013-10-10 NOTE — Progress Notes (Signed)
ELECTROPHYSIOLOGY OFFICE NOTE  Patient ID: Danielle Roth MRN: [id number], DOB/AGE: Sep 30, 1918   Date of Visit: 10/10/2013  Primary Physician: Adrian Prince, MD Primary Cardiologist: Hillis Range, MD Reason for Visit: EP/device follow-up  History of Present Illness  Danielle Roth is a 79 y.o. female with sinus node dysfunction s/p PPM implant who presents today for routine electrophysiology followup. Since last being seen in our clinic, she reports she is doing well and has no complaints. She denies chest pain or shortness of breath. She denies palpitations, dizziness, near syncope or syncope. She denies LE swelling, orthopnea or PND. She is compliant with medications.  Past Medical History Past Medical History  Diagnosis Date  . Breast cancer     left mastectomy  . Glaucoma   . Hyperlipidemia   . Hypertension   . Stroke   . Paroxysmal atrial fibrillation     on coumadin  . Gout   . SSS (sick sinus syndrome) 2007    PPM 2007 by Dr. Reyes Ivan  . Pulmonary embolus     on coumadin    Past Surgical History Past Surgical History  Procedure Laterality Date  . Mastectomy      left  . Tonsillectomy    . Pacemaker insertion  2007    dual-chamber permanent pacemaker implantation by Dr Reyes Ivan MDT    Allergies/Intolerances Allergies  Allergen Reactions  . Codeine   . Diclofenac Sodium   . Iodine   . Other Nausea And Vomiting    Walnuts  . Sulfonamide Derivatives     Current Home Medications Current Outpatient Prescriptions  Medication Sig Dispense Refill  . allopurinol (ZYLOPRIM) 300 MG tablet Take 300 mg by mouth daily.        . bimatoprost (LUMIGAN) 0.03 % ophthalmic solution 1 drop at bedtime.        . hydrochlorothiazide (MICROZIDE) 12.5 MG capsule Take 12.5 mg by mouth. Wednesday AND Saturday      . levETIRAcetam (KEPPRA) 250 MG tablet TAKE 1 TABLET IN THE MORNING AND 1 TABLETS IN THE EVENING (PLEASE SCHEDULE APPOINTMENT)      . Multiple Vitamin  (MULTIVITAMIN) tablet Take 1 tablet by mouth daily.        Marland Kitchen omeprazole (PRILOSEC) 20 MG capsule Take 20 mg by mouth daily.        . Polyethylene Glycol 3350 (GLYCOLAX PO) Take by mouth daily.       . pravastatin (PRAVACHOL) 40 MG tablet Take 40 mg by mouth daily.        . Timolol Maleate 0.5 % (DAILY) SOLN Apply to eye 2 (two) times daily.        . Trospium Chloride 60 MG CP24 Take 60 mg by mouth daily.        Marland Kitchen warfarin (COUMADIN) 3 MG tablet Take by mouth as needed. As directed by Anticoagulation Clinic       No current facility-administered medications for this visit.    Social History Social History  . Marital Status: Widowed   Occupational History  . retired    Social History Main Topics  . Smoking status: Never Smoker   . Smokeless tobacco: Never Used  . Alcohol Use: No  . Drug Use: No   Review of Systems General: No chills, fever, night sweats or weight changes Cardiovascular: No chest pain, dyspnea on exertion, edema, orthopnea, palpitations, paroxysmal nocturnal dyspnea Dermatological: No rash, lesions or masses Respiratory: No cough, dyspnea Urologic: No hematuria, dysuria Abdominal: No nausea, vomiting, diarrhea, bright  red blood per rectum, melena, or hematemesis Neurologic: No visual changes, weakness, changes in mental status All other systems reviewed and are otherwise negative except as noted above.  Physical Exam Vitals: Blood pressure 118/58, pulse 60, height 5\' 6"  (1.676 m), weight 149 lb (67.586 kg), SpO2 97.00%.  General: Well developed, well appearing 79 y.o. female in no acute distress. HEENT: Normocephalic, atraumatic. EOMs intact. Sclera nonicteric. Oropharynx clear.  Neck: Supple. No JVD. Lungs: Respirations regular and unlabored, CTA bilaterally. No wheezes, rales or rhonchi. Heart: RRR. S1, S2 present. No murmurs, rub, S3 or S4. Abdomen: Soft, non-distended.  Extremities: No clubbing, cyanosis or edema. PT/Radials 2+ and equal  bilaterally. Psych: Normal affect. Neuro: Alert and oriented X 3. Moves all extremities spontaneously.   Diagnostics Device interrogation today - normal PPM function; no programming changes made; see PaceArt report  Assessment and Plan 1. Bradycardia / sinus node dysfunction s/p PPM implant - normal device function - no programming changes made - see PaceArt report - return for follow-up with Dr. Johney Frame in 6 months 2. PAF - stable - continue warfarin for stroke risk reduction 3. HTN - stable; normotensive currently - no changes made  Signed, Swannie Milius, PA-C 10/10/2013, 11:25 PM

## 2013-10-22 ENCOUNTER — Emergency Department (HOSPITAL_COMMUNITY)
Admission: EM | Admit: 2013-10-22 | Discharge: 2013-10-23 | Disposition: A | Discharge status: Home / Self Care | Payer: Medicare Other | Attending: Emergency Medicine | Admitting: Emergency Medicine

## 2013-10-22 ENCOUNTER — Emergency Department (HOSPITAL_COMMUNITY): Payer: Medicare Other

## 2013-10-22 ENCOUNTER — Encounter (HOSPITAL_COMMUNITY): Payer: Self-pay | Admitting: Emergency Medicine

## 2013-10-22 DIAGNOSIS — I4891 Unspecified atrial fibrillation: Secondary | ICD-10-CM | POA: Insufficient documentation

## 2013-10-22 DIAGNOSIS — Z853 Personal history of malignant neoplasm of breast: Secondary | ICD-10-CM | POA: Insufficient documentation

## 2013-10-22 DIAGNOSIS — Z8673 Personal history of transient ischemic attack (TIA), and cerebral infarction without residual deficits: Secondary | ICD-10-CM | POA: Insufficient documentation

## 2013-10-22 DIAGNOSIS — Z79899 Other long term (current) drug therapy: Secondary | ICD-10-CM | POA: Insufficient documentation

## 2013-10-22 DIAGNOSIS — N390 Urinary tract infection, site not specified: Secondary | ICD-10-CM | POA: Insufficient documentation

## 2013-10-22 DIAGNOSIS — H409 Unspecified glaucoma: Secondary | ICD-10-CM | POA: Insufficient documentation

## 2013-10-22 DIAGNOSIS — Z7901 Long term (current) use of anticoagulants: Secondary | ICD-10-CM | POA: Insufficient documentation

## 2013-10-22 DIAGNOSIS — Z882 Allergy status to sulfonamides status: Secondary | ICD-10-CM | POA: Insufficient documentation

## 2013-10-22 DIAGNOSIS — R4182 Altered mental status, unspecified: Secondary | ICD-10-CM | POA: Insufficient documentation

## 2013-10-22 DIAGNOSIS — F05 Delirium due to known physiological condition: Secondary | ICD-10-CM | POA: Insufficient documentation

## 2013-10-22 DIAGNOSIS — Z8679 Personal history of other diseases of the circulatory system: Secondary | ICD-10-CM | POA: Insufficient documentation

## 2013-10-22 DIAGNOSIS — E785 Hyperlipidemia, unspecified: Secondary | ICD-10-CM | POA: Insufficient documentation

## 2013-10-22 DIAGNOSIS — Z86711 Personal history of pulmonary embolism: Secondary | ICD-10-CM | POA: Insufficient documentation

## 2013-10-22 DIAGNOSIS — M109 Gout, unspecified: Secondary | ICD-10-CM | POA: Insufficient documentation

## 2013-10-22 DIAGNOSIS — I10 Essential (primary) hypertension: Secondary | ICD-10-CM | POA: Insufficient documentation

## 2013-10-22 DIAGNOSIS — Z885 Allergy status to narcotic agent status: Secondary | ICD-10-CM | POA: Insufficient documentation

## 2013-10-22 LAB — CBC WITH DIFFERENTIAL/PLATELET
Basophils Relative: 0 % (ref 0–1)
Eosinophils Absolute: 0.1 10*3/uL (ref 0.0–0.7)
Eosinophils Relative: 1 % (ref 0–5)
Hemoglobin: 13.2 g/dL (ref 12.0–15.0)
Lymphs Abs: 1.6 10*3/uL (ref 0.7–4.0)
MCH: 32 pg (ref 26.0–34.0)
MCHC: 35.2 g/dL (ref 30.0–36.0)
MCV: 91 fL (ref 78.0–100.0)
Monocytes Absolute: 0.7 10*3/uL (ref 0.1–1.0)
Monocytes Relative: 7 % (ref 3–12)
Neutrophils Relative %: 79 % — ABNORMAL HIGH (ref 43–77)
RBC: 4.12 MIL/uL (ref 3.87–5.11)

## 2013-10-22 LAB — URINALYSIS, ROUTINE W REFLEX MICROSCOPIC
Glucose, UA: NEGATIVE mg/dL
Ketones, ur: 15 mg/dL — AB
Nitrite: POSITIVE — AB
Specific Gravity, Urine: 1.018 (ref 1.005–1.030)
pH: 5 (ref 5.0–8.0)

## 2013-10-22 LAB — COMPREHENSIVE METABOLIC PANEL
Albumin: 3.6 g/dL (ref 3.5–5.2)
Alkaline Phosphatase: 96 U/L (ref 39–117)
BUN: 23 mg/dL (ref 6–23)
Calcium: 10.8 mg/dL — ABNORMAL HIGH (ref 8.4–10.5)
Creatinine, Ser: 1.14 mg/dL — ABNORMAL HIGH (ref 0.50–1.10)
GFR calc Af Amer: 47 mL/min — ABNORMAL LOW (ref >90)
Glucose, Bld: 142 mg/dL — ABNORMAL HIGH (ref 70–99)
Total Protein: 7.2 g/dL (ref 6.0–8.3)

## 2013-10-22 LAB — PROTIME-INR: Prothrombin Time: 17.3 seconds — ABNORMAL HIGH (ref 11.6–15.2)

## 2013-10-22 LAB — URINE MICROSCOPIC-ADD ON

## 2013-10-22 MED ORDER — LEVOFLOXACIN 500 MG PO TABS: 500.0000 mg | ORAL_TABLET | Freq: Every day | ORAL | Status: DC

## 2013-10-22 MED ORDER — LEVOFLOXACIN IN D5W 500 MG/100ML IV SOLN
500.0000 mg | INTRAVENOUS | Status: AC
  Administered 2013-10-22: 500 mg via INTRAVENOUS
  Filled 2013-10-22: qty 100

## 2013-10-22 NOTE — ED Provider Notes (Addendum)
CSN: [id number]     Arrival date & time 10/22/13  2033 History   First MD Initiated Contact with Patient 10/22/13 2046     Chief Complaint  Patient presents with  . Altered Mental Status   (Consider location/radiation/quality/duration/timing/severity/associated sxs/prior Treatment) HPI Comments: Patient is a 79 year old female past medical history significant for paroxysmal atrial fibrillation, pulmonary embolism. She was sent from an extended care facility for evaluation of confusion. She was evaluated by her care worker who felt that she was not quite herself. The patient denies to me she is having any discomfort or any symptoms to this effect. She denies headache, chest pain, abdominal pain, nausea, vomiting. She does report loose stools for a day or so which have been non-bloody. She denies vomiting the  Patient is a 79 y.o. female presenting with altered mental status. The history is provided by the patient.  Altered Mental Status Presenting symptoms: confusion and disorientation   Severity:  Mild Most recent episode:  Today Episode history:  Single Timing:  Constant Progression:  Unchanged Chronicity:  New Context: dementia     Past Medical History  Diagnosis Date  . Breast cancer     left mastectomy  . Glaucoma   . Hyperlipidemia   . Hypertension   . Stroke   . Paroxysmal atrial fibrillation     on coumadin  . Gout   . SSS (sick sinus syndrome) 2007    PPM 2007 by Dr. Reyes Ivan  . Pulmonary embolus     on coumadin   Past Surgical History  Procedure Laterality Date  . Mastectomy      left  . Tonsillectomy    . Pacemaker insertion  2007    dual-chamber permanent pacemaker implantation by Dr Reyes Ivan MDT   Family History  Problem Relation Age of Onset  . Heart disease Mother   . Kidney disease Mother   . Heart disease Father   . Colon cancer Neg Hx    History  Substance Use Topics  . Smoking status: Never Smoker   . Smokeless tobacco: Never Used  . Alcohol Use:  No   OB History   Grav Para Term Preterm Abortions TAB SAB Ect Mult Living                 Review of Systems  Psychiatric/Behavioral: Positive for confusion.  All other systems reviewed and are negative.    Allergies  Codeine; Diclofenac sodium; Iodine; Other; and Sulfonamide derivatives  Home Medications   Current Outpatient Rx  Name  Route  Sig  Dispense  Refill  . allopurinol (ZYLOPRIM) 300 MG tablet   Oral   Take 300 mg by mouth daily.           . bimatoprost (LUMIGAN) 0.03 % ophthalmic solution      1 drop at bedtime.           . hydrochlorothiazide (MICROZIDE) 12.5 MG capsule   Oral   Take 12.5 mg by mouth. Wednesday AND Saturday         . levETIRAcetam (KEPPRA) 250 MG tablet      TAKE 1 TABLET IN THE MORNING AND 1 TABLETS IN THE EVENING (PLEASE SCHEDULE APPOINTMENT)         . Multiple Vitamin (MULTIVITAMIN) tablet   Oral   Take 1 tablet by mouth daily.           Marland Kitchen omeprazole (PRILOSEC) 20 MG capsule   Oral   Take 20 mg by mouth daily.           Marland Kitchen  Polyethylene Glycol 3350 (GLYCOLAX PO)   Oral   Take by mouth daily.          . pravastatin (PRAVACHOL) 40 MG tablet   Oral   Take 40 mg by mouth daily.           . Timolol Maleate 0.5 % (DAILY) SOLN   Ophthalmic   Apply to eye 2 (two) times daily.           . Trospium Chloride 60 MG CP24   Oral   Take 60 mg by mouth daily.           Marland Kitchen warfarin (COUMADIN) 3 MG tablet   Oral   Take by mouth as needed. As directed by Anticoagulation Clinic          BP 130/58  Pulse 89  Temp(Src) 98.3 F (36.8 C) (Oral)  Resp 18  SpO2 100% Physical Exam  Nursing note and vitals reviewed. Constitutional: She is oriented to person, place, and time. She appears well-developed and well-nourished. No distress.  Patient is an elderly female in no acute distress. She is awake alert and pleasant. She is oriented to person place time right.  HENT:  Head: Normocephalic and atraumatic.  Neck: Normal  range of motion. Neck supple.  Cardiovascular: Normal rate and regular rhythm.  Exam reveals no gallop and no friction rub.   No murmur heard. Pulmonary/Chest: Effort normal and breath sounds normal. No respiratory distress. She has no wheezes.  Abdominal: Soft. Bowel sounds are normal. She exhibits no distension. There is no tenderness.  Musculoskeletal: Normal range of motion.  Neurological: She is alert and oriented to person, place, and time.  Skin: Skin is warm and dry. She is not diaphoretic.    ED Course  Procedures (including critical care time) Labs Review Labs Reviewed  CBC WITH DIFFERENTIAL  COMPREHENSIVE METABOLIC PANEL  URINALYSIS, ROUTINE W REFLEX MICROSCOPIC   Imaging Review No results found.  EKG Interpretation   None       MDM  No diagnosis found. Patient is a 79 year old female sent from extended care facility for evaluation of mental status change. She denies to me that she is having any symptoms and states that she feels just fine. A workup was initiated including laboratory studies, urinalysis, and CT scan of the head. These were all unremarkable with the exception of a mild elevated white count and evidence for urinary tract infection. She will be treated with antibiotics but otherwise appears appropriate for discharge. She is not febrile and does not appear toxic. She will be given IV Levaquin and discharged with a prescription for oral Levaquin.    Geoffery Lyons, MD 10/22/13 1610  Geoffery Lyons, MD 10/22/13 2320

## 2013-10-22 NOTE — ED Notes (Signed)
Pt arrives to the ED via EMS from living facility where the pt was seen by her care taker that is new to the pt and stated the pt was more altered than normal.  EMS negative for stroke evaluation.  Pt is A&O x 4.

## 2013-10-24 LAB — URINE CULTURE

## 2013-11-01 ENCOUNTER — Emergency Department (HOSPITAL_COMMUNITY): Payer: Medicare Other

## 2013-11-01 ENCOUNTER — Encounter: Payer: Self-pay | Admitting: Internal Medicine

## 2013-11-01 ENCOUNTER — Emergency Department (HOSPITAL_COMMUNITY)
Admission: EM | Admit: 2013-11-01 | Discharge: 2013-11-01 | Disposition: A | Discharge status: Home / Self Care | Payer: Medicare Other | Attending: Emergency Medicine | Admitting: Emergency Medicine

## 2013-11-01 ENCOUNTER — Encounter (HOSPITAL_COMMUNITY): Payer: Self-pay | Admitting: Radiology

## 2013-11-01 DIAGNOSIS — Z86711 Personal history of pulmonary embolism: Secondary | ICD-10-CM | POA: Insufficient documentation

## 2013-11-01 DIAGNOSIS — W19XXXA Unspecified fall, initial encounter: Secondary | ICD-10-CM

## 2013-11-01 DIAGNOSIS — Y9389 Activity, other specified: Secondary | ICD-10-CM | POA: Insufficient documentation

## 2013-11-01 DIAGNOSIS — IMO0002 Reserved for concepts with insufficient information to code with codable children: Secondary | ICD-10-CM | POA: Insufficient documentation

## 2013-11-01 DIAGNOSIS — Y9210 Unspecified residential institution as the place of occurrence of the external cause: Secondary | ICD-10-CM | POA: Insufficient documentation

## 2013-11-01 DIAGNOSIS — S79919A Unspecified injury of unspecified hip, initial encounter: Secondary | ICD-10-CM | POA: Insufficient documentation

## 2013-11-01 DIAGNOSIS — E785 Hyperlipidemia, unspecified: Secondary | ICD-10-CM | POA: Insufficient documentation

## 2013-11-01 DIAGNOSIS — Z8673 Personal history of transient ischemic attack (TIA), and cerebral infarction without residual deficits: Secondary | ICD-10-CM | POA: Insufficient documentation

## 2013-11-01 DIAGNOSIS — I10 Essential (primary) hypertension: Secondary | ICD-10-CM | POA: Insufficient documentation

## 2013-11-01 DIAGNOSIS — W1809XA Striking against other object with subsequent fall, initial encounter: Secondary | ICD-10-CM | POA: Insufficient documentation

## 2013-11-01 DIAGNOSIS — I4891 Unspecified atrial fibrillation: Secondary | ICD-10-CM | POA: Insufficient documentation

## 2013-11-01 DIAGNOSIS — S0091XA Abrasion of unspecified part of head, initial encounter: Secondary | ICD-10-CM

## 2013-11-01 DIAGNOSIS — M25552 Pain in left hip: Secondary | ICD-10-CM

## 2013-11-01 DIAGNOSIS — Z79899 Other long term (current) drug therapy: Secondary | ICD-10-CM | POA: Insufficient documentation

## 2013-11-01 DIAGNOSIS — R5381 Other malaise: Secondary | ICD-10-CM | POA: Insufficient documentation

## 2013-11-01 DIAGNOSIS — Z792 Long term (current) use of antibiotics: Secondary | ICD-10-CM | POA: Insufficient documentation

## 2013-11-01 DIAGNOSIS — Z7901 Long term (current) use of anticoagulants: Secondary | ICD-10-CM | POA: Insufficient documentation

## 2013-11-01 DIAGNOSIS — S4980XA Other specified injuries of shoulder and upper arm, unspecified arm, initial encounter: Secondary | ICD-10-CM | POA: Insufficient documentation

## 2013-11-01 DIAGNOSIS — M25512 Pain in left shoulder: Secondary | ICD-10-CM

## 2013-11-01 DIAGNOSIS — S46909A Unspecified injury of unspecified muscle, fascia and tendon at shoulder and upper arm level, unspecified arm, initial encounter: Secondary | ICD-10-CM | POA: Insufficient documentation

## 2013-11-01 DIAGNOSIS — Z853 Personal history of malignant neoplasm of breast: Secondary | ICD-10-CM | POA: Insufficient documentation

## 2013-11-01 DIAGNOSIS — R42 Dizziness and giddiness: Secondary | ICD-10-CM

## 2013-11-01 DIAGNOSIS — M109 Gout, unspecified: Secondary | ICD-10-CM | POA: Insufficient documentation

## 2013-11-01 DIAGNOSIS — Z8669 Personal history of other diseases of the nervous system and sense organs: Secondary | ICD-10-CM | POA: Insufficient documentation

## 2013-11-01 LAB — URINALYSIS, ROUTINE W REFLEX MICROSCOPIC
Glucose, UA: NEGATIVE mg/dL
Hgb urine dipstick: NEGATIVE
Ketones, ur: 15 mg/dL — AB
Nitrite: NEGATIVE
Protein, ur: NEGATIVE mg/dL
Specific Gravity, Urine: 1.015 (ref 1.005–1.030)
Urobilinogen, UA: 1 mg/dL (ref 0.0–1.0)
pH: 5.5 (ref 5.0–8.0)

## 2013-11-01 LAB — CBC
HCT: 37 % (ref 36.0–46.0)
Hemoglobin: 12.6 g/dL (ref 12.0–15.0)
MCH: 31.5 pg (ref 26.0–34.0)
MCHC: 34.1 g/dL (ref 30.0–36.0)
MCV: 92.5 fL (ref 78.0–100.0)
Platelets: 182 10*3/uL (ref 150–400)
RBC: 4 MIL/uL (ref 3.87–5.11)
RDW: 14.2 % (ref 11.5–15.5)
WBC: 5.9 10*3/uL (ref 4.0–10.5)

## 2013-11-01 LAB — URINE MICROSCOPIC-ADD ON

## 2013-11-01 LAB — BASIC METABOLIC PANEL
BUN: 16 mg/dL (ref 6–23)
CO2: 27 mEq/L (ref 19–32)
Calcium: 10.5 mg/dL (ref 8.4–10.5)
Chloride: 101 mEq/L (ref 96–112)
Creatinine, Ser: 1.06 mg/dL (ref 0.50–1.10)
GFR calc Af Amer: 51 mL/min — ABNORMAL LOW (ref >90)
GFR calc non Af Amer: 44 mL/min — ABNORMAL LOW (ref >90)
Glucose, Bld: 114 mg/dL — ABNORMAL HIGH (ref 70–99)
Potassium: 4 mEq/L (ref 3.5–5.1)
Sodium: 138 mEq/L (ref 135–145)

## 2013-11-01 LAB — PROTIME-INR
INR: 2.14 — ABNORMAL HIGH (ref 0.00–1.49)
Prothrombin Time: 23.2 seconds — ABNORMAL HIGH (ref 11.6–15.2)

## 2013-11-01 MED ORDER — MECLIZINE HCL 25 MG PO TABS
12.5000 mg | ORAL_TABLET | Freq: Once | ORAL | Status: AC
  Administered 2013-11-01: 12.5 mg via ORAL
  Filled 2013-11-01: qty 1

## 2013-11-01 MED ORDER — ACETAMINOPHEN 500 MG PO TABS: 500.0000 mg | ORAL_TABLET | Freq: Four times a day (QID) | ORAL | Status: DC | PRN

## 2013-11-01 MED ORDER — CEPHALEXIN 500 MG PO CAPS: 500.0000 mg | ORAL_CAPSULE | Freq: Four times a day (QID) | ORAL | Status: DC

## 2013-11-01 MED ORDER — SODIUM CHLORIDE 0.9 % IV BOLUS (SEPSIS)
1000.0000 mL | INTRAVENOUS | Status: AC
  Administered 2013-11-01: 1000 mL via INTRAVENOUS

## 2013-11-01 NOTE — ED Notes (Signed)
Pt removed from [facility]. Laceration assessed. Bleeding controlled. Saline applied to area to lift up dried blood. Pt remains AO

## 2013-11-01 NOTE — ED Provider Notes (Signed)
CSN: [id number]     Arrival date & time 11/01/13  1254 History   First MD Initiated Contact with Patient 11/01/13 1257     Chief Complaint  Patient presents with  . Fall  . Head Laceration   (Consider location/radiation/quality/duration/timing/severity/associated sxs/prior Treatment) HPI Pt is a 79yo female with hx of paroxysmal afib, HTN, and PE for which she takes coumadin, BIB EMS after fall at extended care facility, [facility].  Staff report pt stated she felt weak while standing, no LOC but fell onto left buttock and hit the back of her head onto the floor.  Pt is c/o head pain but denies back or hip pain. Per staff, pt is alert and oriented per her baseline.  Staff also report pt fell last week but they are unsure how she fell. Pt states she felt like she had "too much going on" when she felt dizzy.  States someone was doing her hair at the time, but denies ever completely losing consciousness. No hx of recent illness. No hx of fevers, vomiting or diarrhea.  Past Medical History  Diagnosis Date  . Breast cancer     left mastectomy  . Glaucoma   . Hyperlipidemia   . Hypertension   . Stroke   . Paroxysmal atrial fibrillation     on coumadin  . Gout   . SSS (sick sinus syndrome) 2007    PPM 2007 by Dr. Reyes Ivan  . Pulmonary embolus     on coumadin   Past Surgical History  Procedure Laterality Date  . Mastectomy      left  . Tonsillectomy    . Pacemaker insertion  2007    dual-chamber permanent pacemaker implantation by Dr Reyes Ivan MDT   Family History  Problem Relation Age of Onset  . Heart disease Mother   . Kidney disease Mother   . Heart disease Father   . Colon cancer Neg Hx    History  Substance Use Topics  . Smoking status: Never Smoker   . Smokeless tobacco: Never Used  . Alcohol Use: No   OB History   Grav Para Term Preterm Abortions TAB SAB Ect Mult Living                 Review of Systems  Cardiovascular: Negative for chest pain.  Musculoskeletal:  Negative for arthralgias, back pain, neck pain and neck stiffness.  Skin: Positive for wound.  Neurological: Positive for weakness and headaches. Negative for dizziness and light-headedness.  All other systems reviewed and are negative.    Allergies  Codeine; Diclofenac sodium; Iodine; Other; and Sulfonamide derivatives  Home Medications   Current Outpatient Rx  Name  Route  Sig  Dispense  Refill  . allopurinol (ZYLOPRIM) 300 MG tablet   Oral   Take 300 mg by mouth daily.           . bimatoprost (LUMIGAN) 0.03 % ophthalmic solution   Both Eyes   Place 1 drop into both eyes at bedtime.          . hydrochlorothiazide (MICROZIDE) 12.5 MG capsule   Oral   Take 12.5 mg by mouth. Wednesday AND Saturday         . levETIRAcetam (KEPPRA) 250 MG tablet   Oral   Take 250 mg by mouth every 12 (twelve) hours.         Marland Kitchen levofloxacin (LEVAQUIN) 500 MG tablet   Oral   Take 1 tablet (500 mg total) by mouth daily.  6 tablet   0   . Multiple Vitamin (MULTIVITAMIN) tablet   Oral   Take 1 tablet by mouth daily.           Marland Kitchen omeprazole (PRILOSEC) 20 MG capsule   Oral   Take 20 mg by mouth daily.           . pravastatin (PRAVACHOL) 40 MG tablet   Oral   Take 40 mg by mouth daily.           . Timolol Maleate 0.5 % (DAILY) SOLN   Ophthalmic   Apply to eye 2 (two) times daily.           . Trospium Chloride 60 MG CP24   Oral   Take 60 mg by mouth daily.           Marland Kitchen warfarin (COUMADIN) 2 MG tablet   Oral   Take 2 mg by mouth 4 (four) times a week. *Sunday, Tuesday, Wednesday, and Friday         . warfarin (COUMADIN) 3 MG tablet   Oral   Take 3 mg by mouth 3 (three) times a week. *on Monday, Thursday, and Saturday          BP 154/66  Pulse 83  Resp 18  SpO2 100% Physical Exam  Nursing note and vitals reviewed. Constitutional: She is oriented to person, place, and time. She appears well-developed and well-nourished. No distress.  Pt in c-collar. Awake  and alert.  HENT:  Head: Normocephalic.  abrasion to left occipital region of scalp. No active bleeding.  Eyes: Conjunctivae are normal. No scleral icterus.  Neck: Normal range of motion.  Cardiovascular: Normal rate, regular rhythm and normal heart sounds.   Pulmonary/Chest: Effort normal and breath sounds normal. No respiratory distress. She has no wheezes. She has no rales. She exhibits no tenderness.  Abdominal: Soft. Bowel sounds are normal. She exhibits no distension and no mass. There is no tenderness. There is no rebound and no guarding.  Musculoskeletal: Normal range of motion. She exhibits tenderness ( mild tenderness to left hip. ). She exhibits no edema.  Tenderness to left hip and buttock but FROM. Pedal pulses 2+  Neurological: She is alert and oriented to person, place, and time. No cranial nerve deficit. Coordination normal.  Skin: Skin is warm and dry. She is not diaphoretic.    ED Course  Procedures (including critical care time) Labs Review Labs Reviewed  PROTIME-INR - Abnormal; Notable for the following:    Prothrombin Time 23.2 (*)    INR 2.14 (*)    All other components within normal limits  BASIC METABOLIC PANEL - Abnormal; Notable for the following:    Glucose, Bld 114 (*)    GFR calc non Af Amer 44 (*)    GFR calc Af Amer 51 (*)    All other components within normal limits  URINALYSIS, ROUTINE W REFLEX MICROSCOPIC - Abnormal; Notable for the following:    APPearance CLOUDY (*)    Bilirubin Urine SMALL (*)    Ketones, ur 15 (*)    Leukocytes, UA MODERATE (*)    All other components within normal limits  URINE MICROSCOPIC-ADD ON - Abnormal; Notable for the following:    Squamous Epithelial / LPF MANY (*)    Bacteria, UA FEW (*)    All other components within normal limits  CBC   Imaging Review Dg Hip Complete Left  11/01/2013   CLINICAL DATA:  Fall today with left hip  pain.  EXAM: LEFT HIP - COMPLETE 2+ VIEW  COMPARISON:  09/09/2012  FINDINGS:  Vascular calcifications. Degenerative sclerosis of the left sacroiliac joint. Mild osteoarthritis within bilateral hips.  No acute fracture or dislocation. Relatively mild for age osteopenia.  IMPRESSION: No acute osseous abnormality.  Given osteopenia, if there is a strong clinical concern of fracture, consider further evaluation with MRI.   Electronically Signed   By: Jeronimo Greaves M.D.   On: 11/01/2013 14:33   Ct Head Wo Contrast  11/01/2013   CLINICAL DATA:  79 year old female with fall, head and neck injury with headache and neck pain.  EXAM: CT HEAD WITHOUT CONTRAST  CT CERVICAL SPINE WITHOUT CONTRAST  TECHNIQUE: Multidetector CT imaging of the head and cervical spine was performed following the standard protocol without intravenous contrast. Multiplanar CT image reconstructions of the cervical spine were also generated.  COMPARISON:  10/22/2013 and prior CTs dating back to 03/03/2006  FINDINGS: CT HEAD FINDINGS  Atrophy and chronic small-vessel white matter ischemic changes are again noted.  No acute intracranial abnormalities are identified, including mass lesion or mass effect, hydrocephalus, extra-axial fluid collection, midline shift, hemorrhage, or acute infarction. The visualized bony calvarium is unremarkable.  CT CERVICAL SPINE FINDINGS  Normal alignment is identified.  There is no evidence of acute fracture, subluxation or prevertebral soft tissue swelling.  Multilevel degenerative disc disease, spondylosis and facet arthropathy is unchanged.  These degenerative changes contribute to bony foraminal narrowing, left greater than right, at several levels and mild central spinal narrowing at C5-6 and C6-7.  No focal bony lesions are identified.  The soft tissue structures are within normal limits.  IMPRESSION: No evidence of acute intracranial abnormality.  No static evidence of acute injury to the cervical spine.  Atrophy an chronic small-vessel white matter ischemic changes.  Multilevel cervical  spine degenerative changes.   Electronically Signed   By: Laveda Abbe M.D.   On: 11/01/2013 14:17   Ct Cervical Spine Wo Contrast  11/01/2013   CLINICAL DATA:  79 year old female with fall, head and neck injury with headache and neck pain.  EXAM: CT HEAD WITHOUT CONTRAST  CT CERVICAL SPINE WITHOUT CONTRAST  TECHNIQUE: Multidetector CT imaging of the head and cervical spine was performed following the standard protocol without intravenous contrast. Multiplanar CT image reconstructions of the cervical spine were also generated.  COMPARISON:  10/22/2013 and prior CTs dating back to 03/03/2006  FINDINGS: CT HEAD FINDINGS  Atrophy and chronic small-vessel white matter ischemic changes are again noted.  No acute intracranial abnormalities are identified, including mass lesion or mass effect, hydrocephalus, extra-axial fluid collection, midline shift, hemorrhage, or acute infarction. The visualized bony calvarium is unremarkable.  CT CERVICAL SPINE FINDINGS  Normal alignment is identified.  There is no evidence of acute fracture, subluxation or prevertebral soft tissue swelling.  Multilevel degenerative disc disease, spondylosis and facet arthropathy is unchanged.  These degenerative changes contribute to bony foraminal narrowing, left greater than right, at several levels and mild central spinal narrowing at C5-6 and C6-7.  No focal bony lesions are identified.  The soft tissue structures are within normal limits.  IMPRESSION: No evidence of acute intracranial abnormality.  No static evidence of acute injury to the cervical spine.  Atrophy an chronic small-vessel white matter ischemic changes.  Multilevel cervical spine degenerative changes.   Electronically Signed   By: Laveda Abbe M.D.   On: 11/01/2013 14:17   Dg Shoulder Left  11/01/2013   CLINICAL DATA:  Fall, left  shoulder pain  EXAM: LEFT SHOULDER - 2+ VIEW  COMPARISON:  09/09/2012  FINDINGS: Three views of left shoulder submitted. No acute fracture or  subluxation. Again noted surgical clips in left axilla. Mild degenerative changes left AC joint.  IMPRESSION: No acute fracture or subluxation. Mild degenerative changes left AC joint.   Electronically Signed   By: Natasha Mead M.D.   On: 11/01/2013 15:16    EKG Interpretation     Ventricular Rate:  76 PR Interval:  166 QRS Duration: 143 QT Interval:  430 QTC Calculation: 483 R Axis:   -30 Text Interpretation:  Sinus rhythm LAD, LBBB . Unchanged from prior.            MDM   1. Fall, initial encounter   2. Dizzy   3. Abrasion of head, initial encounter   4. Left shoulder pain   5. Left hip pain    Pt BIB EMS for fall onto left hip and hitting back of head after feeling dizzy but denies LOC.  Pt is alert and oriented c/o head pain. Pt is on coumadin for afib and hx of PE.    CBC, BMP, UA, and PT/INR performed: unremarkable.  CT head and neck: unremarkable, no evidence of intracranial bleed or acute injury. Plain films hip:   After pt returned from initial imaging she stated her left shoulder has begun to hurt. Pt states she feels like it is because she has been lying in the exam bed for so long.  Will get image of left shoulder to ensure no fracture.    Pt also requesting tylenol for head pain.   Plain films of shoulder and left hip: unremarkable. Pt is able to stand w/o pain in left hip.  Do not believe further imaging of hip is needed at this time.  Pt does states she still feels dizzy when being sat up in bed for cleaning of head wound.    Orthostatic vitals: unremarkable  Will give 1L of fluids.  Still waiting on pt's daughter to get here for better evaluation of pt's baseline.  Plan is to ambulate pt again when daughter arrives.  If still dizzy and not normal for pt, consider admission for observation and consider tx of possible UTI.  UA: moderate leukocytes but many epithelial cells.  Signed out to Dr. Blinda Leatherwood at shift change.    Junius Finner, PA-C 11/01/13  [phone]

## 2013-11-01 NOTE — ED Notes (Addendum)
PT and pt's daughter comfortable with d/c and f/u instructions. Pt's daughter declines transport, states will take pt back herself. PT able to ambulate to restroom with staff assistance, pt normally uses walker. Pt daughter states is comfortable taking pt home and assisting pt with ambulation.

## 2013-11-01 NOTE — ED Notes (Signed)
Per EMS: PT from [facility] with fall, hit left side of head and left buttocks. No LOC. Bleeding controlled. Placed in LSB, C collar. AO at baseline. VSS.

## 2013-11-01 NOTE — ED Notes (Signed)
[phone]  

## 2013-11-01 NOTE — ED Notes (Signed)
Received call from assisted living center regarding patient's status. Updated, RN at center updated and pt updated on call.

## 2013-11-01 NOTE — ED Provider Notes (Signed)
Signed out to me to follow up after her IV fluids. Patient had a fall earlier, and suffered minor head injury. CT scan was unremarkable. She began to complain of dizziness and therefore was given some fluids. After the fluids, vital signs have been stable, but no focal abnormalities on exam. She was ambulated in the hallway and still complaining of dizziness. She was given meclizine without improvement. Further discussion with the family, however reveals that she has chronic dizziness and vertigo. She is a poor story in, could not tell me if it is any worse than usual, but when her daughter just asked her she said it is about the same as it normally is. Daughter is comfortable taking her home. The patient did have an equivocal urinalysis. She is perfectly treated with Levaquin based on the culture, but as there are still abnormalities, will treat with Keflex. Followup with primary doctor, culture.  Gilda Crease, MD 11/01/13 2154

## 2013-11-01 NOTE — ED Notes (Signed)
Pt able to sit on edge of bed but states she does not want to stand and walk until she has eaten something because she still feels dizzy. Daughter remains at bedside. Dr. Blinda Leatherwood made aware, states ok to give meal

## 2013-11-02 LAB — URINE CULTURE

## 2013-11-02 NOTE — ED Provider Notes (Signed)
Medical screening examination/treatment/procedure(s) were conducted as a shared visit with non-physician practitioner(s) and myself.  I personally evaluated the patient during the encounter. On my exam, pt reported falling from unknown cause, but currently had no complaints other than mild L shoulder pain, specifically no hip pain. Pt found to have no acute bony trauma.  Pt able to stand, though became light headed.  1L NS given as orthostatics also positive. Will reexamine after fluids and plan for Dr. Blinda Leatherwood speak w/ daughter when she arrives.   EKG Interpretation     Ventricular Rate:  76 PR Interval:  166 QRS Duration: 143 QT Interval:  430 QTC Calculation: 483 R Axis:   -30 Text Interpretation:  Sinus rhythm LAD, LBBB . Unchanged from prior.               Shanna Cisco, MD 11/02/13 [phone]

## 2013-11-07 ENCOUNTER — Telehealth (HOSPITAL_COMMUNITY): Payer: Self-pay | Admitting: *Deleted

## 2013-11-23 ENCOUNTER — Other Ambulatory Visit: Payer: Self-pay | Admitting: Neurology

## 2013-12-24 ENCOUNTER — Ambulatory Visit (INDEPENDENT_AMBULATORY_CARE_PROVIDER_SITE_OTHER): Payer: Medicare Other | Admitting: Nurse Practitioner

## 2013-12-24 ENCOUNTER — Encounter: Payer: Self-pay | Admitting: Nurse Practitioner

## 2013-12-24 VITALS — BP 152/63 | HR 62 | Ht 62.5 in | Wt 149.0 lb

## 2013-12-24 DIAGNOSIS — G40209 Localization-related (focal) (partial) symptomatic epilepsy and epileptic syndromes with complex partial seizures, not intractable, without status epilepticus: Secondary | ICD-10-CM | POA: Insufficient documentation

## 2013-12-24 MED ORDER — LEVETIRACETAM 250 MG PO TABS
250.0000 mg | ORAL_TABLET | Freq: Two times a day (BID) | ORAL | Status: AC
Start: 2013-12-24 — End: ?

## 2013-12-24 NOTE — Progress Notes (Signed)
PATIENT: Danielle Roth DOB: 08-28-1918   REASON FOR VISIT: routine follow up for seizures HISTORY FROM: patient and daughter  HISTORY OF PRESENT ILLNESS: 05/19/12 History from: patient  Reason for visit: routine follow up visit  Chief Complaint: Seizures  HPI: Danielle Roth is a 80 year old right-handed white female with a history of simple partial seizures and a history of cerebrovascular disease. The patient is on the keppra, taking 250 mg in the morning, and 500 mg in the evening. The patient is tolerating the medication fairly well, and she has not had any further seizures. The patient does not operate a motor vehicle, and she lives in an independent living situation. The patient has fallen recently and injured her right shoulder, but she has just completed physical therapy for this. The patient overall is doing well.  12-24-13 (LL):  Danielle Roth returns to the office with her daughter for follow up of simple partial seizures.  She has been doing fairly well, had a fall in November with mild shoulder injury and a hospitalization for UTI.  She has has no further seizures and is taking Keppra 250 mg BID.  She resides at [facility].   Review of Systems  Out of a complete 14 system review, the patient complains of only the following symptoms, and all other reviewed systems are negative.  Fatigue, Loss of vision, cold intolerance, Allergies, Hearing loss, trouble swallowing, Murmur, Constipation, Frequent waking, Daytime Sleepiness, Walking difficulty, Frequency of urination, bruise/bleed easily, dizziness, memory loss, decreased concentration.  ALLERGIES: Allergies  Allergen Reactions  . Diclofenac Sodium     The gel  . Iodine   . Other Nausea And Vomiting    Walnuts  . Sulfonamide Derivatives   . Codeine Nausea And Vomiting and Rash    HOME MEDICATIONS: Outpatient Prescriptions Prior to Visit  Medication Sig Dispense Refill  . allopurinol (ZYLOPRIM) 300 MG tablet Take 300 mg by  mouth daily.        . bimatoprost (LUMIGAN) 0.03 % ophthalmic solution Place 1 drop into both eyes at bedtime.       . Multiple Vitamin (MULTIVITAMIN) tablet Take 1 tablet by mouth daily.        Marland Kitchen omeprazole (PRILOSEC) 20 MG capsule Take 20 mg by mouth daily.        . polyethylene glycol (MIRALAX / GLYCOLAX) packet Take 17 g by mouth daily.      . pravastatin (PRAVACHOL) 40 MG tablet Take 40 mg by mouth daily.        . Timolol Maleate 0.5 % (DAILY) SOLN Apply to eye 2 (two) times daily.        . Trospium Chloride 60 MG CP24 Take 60 mg by mouth 2 (two) times daily.      Marland Kitchen warfarin (COUMADIN) 2 MG tablet Take 2 mg by mouth 4 (four) times a week. *Sunday, Tuesday, Wednesday, and Friday      . warfarin (COUMADIN) 3 MG tablet Take 3 mg by mouth 3 (three) times a week. *on Monday, Thursday, and Saturday      . cephALEXin (KEFLEX) 500 MG capsule Take 1 capsule (500 mg total) by mouth 4 (four) times daily.  28 capsule  0  . levETIRAcetam (KEPPRA) 250 MG tablet Take 250 mg by mouth every 12 (twelve) hours.      . hydrochlorothiazide (MICROZIDE) 12.5 MG capsule Take 12.5 mg by mouth. Wednesday AND Saturday      . levETIRAcetam (KEPPRA) 250 MG tablet Take one tablet in  the morning and two tablets in the evening  270 tablet  0   No facility-administered medications prior to visit.    PAST MEDICAL HISTORY: Past Medical History  Diagnosis Date  . Breast cancer     left mastectomy  . Glaucoma   . Hyperlipidemia   . Hypertension   . Stroke   . Paroxysmal atrial fibrillation     on coumadin  . Gout   . SSS (sick sinus syndrome) 2007    PPM 2007 by Dr. Verlon Setting  . Pulmonary embolus     on coumadin  . Partial seizures   . Cerebrovascular disease   . Myocardial infarction   . Degenerative arthritis     PAST SURGICAL HISTORY: Past Surgical History  Procedure Laterality Date  . Mastectomy      left  . Tonsillectomy    . Pacemaker insertion  2007    dual-chamber permanent pacemaker implantation  by [name] MDT    FAMILY HISTORY: Family History  Problem Relation Age of Onset  . Heart disease Mother   . Kidney disease Mother   . Heart disease Father   . Colon cancer Neg Hx     SOCIAL HISTORY: History   Social History  . Marital Status: Widowed    Spouse Name: N/A    Number of Children: N/A  . Years of Education: N/A   Occupational History  . retired    Social History Main Topics  . Smoking status: Never Smoker   . Smokeless tobacco: Never Used  . Alcohol Use: No  . Drug Use: No  . Sexual Activity: Not on file   Other Topics Concern  . Not on file   Social History Narrative   Patient lives in assisted living in [place] at [facility].   Retired Licensed conveyancer.   Previously expressed wishes for DNI/DNR     PHYSICAL EXAM  Filed Vitals:   12/24/13 1117  BP: 152/63  Pulse: 62  Height: 5' 2.5" (1.588 m)  Weight: 149 lb (67.586 kg)   Body mass index is 26.8 kg/(m^2).  Physical Exam  General: This patient is alert and cooperative at the time of the examination. The patient is minimally obese. Skin: No significant peripheral edema is noted.  Neurologic Exam  Cranial Nerves: Facial symmetry is present.  Speech is normal, no aphasia or dysarthria is noted.  Extraocular movements are full,  with the exception of some restriction of superior gaze.  Visual fields are full.   Motor: Patient has good strength in all 4 extremities. Coordination: Patient has good finger-to-nose and heel-to-shin bilaterally. Gait and Station:  The patient has a slightly wide-based gait, uses a walker for ambulation. Tandem gait was not attempted.  Romberg is negative.  No drift is seen.   Reflexes: Deep tendon reflexes are symmetric.  DIAGNOSTIC DATA (LABS, IMAGING, TESTING) - I reviewed patient records, labs, notes, testing and imaging myself where available.  Lab Results  Component Value Date   WBC 5.9 11/01/2013   HGB 12.6 11/01/2013   HCT 37.0 11/01/2013   MCV 92.5  11/01/2013   PLT 182 11/01/2013      Component Value Date/Time   NA 138 11/01/2013 1318   K 4.0 11/01/2013 1318   CL 101 11/01/2013 1318   CO2 27 11/01/2013 1318   GLUCOSE 114* 11/01/2013 1318   BUN 16 11/01/2013 1318   CREATININE 1.06 11/01/2013 1318   CALCIUM 10.5 11/01/2013 1318   PROT 7.2 10/22/2013 2019  ALBUMIN 3.6 10/22/2013 2019   AST 34 10/22/2013 2019   ALT 15 10/22/2013 2019   ALKPHOS 96 10/22/2013 2019   BILITOT 0.8 10/22/2013 2019   GFRNONAA 49* 11/01/2013 1318   GFRAA 14* 11/01/2013 1318    ASSESSMENT AND PLAN 80 y.o. year old female  has a past medical history of Breast cancer; Glaucoma; Hyperlipidemia; Hypertension; Stroke; Paroxysmal atrial fibrillation; Gout; SSS (sick sinus syndrome) (2007); Pulmonary embolus; Partial seizures; Cerebrovascular disease; Myocardial infarction; and Degenerative arthritis. here with: 1. History of simple partial seizures  The patient continues to do well on the [place], and this medication will be continued. The patient will followup in one year. A prescription was sent in to her pharmacy.  Meds ordered this encounter  Medications  . levETIRAcetam (KEPPRA) 250 MG tablet    Sig: Take 1 tablet (250 mg total) by mouth every 12 (twelve) hours.    Dispense:  180 tablet    Refill:  3    Order Specific Question:  Supervising Provider    Answer:  Penni Bombard [[id number]]   Return in about 1 year (around 12/24/2014).  Philmore Pali, MSN, NP-C 12/24/2013, 12:45 PM [facility] [address] [place], [place] [address] [phone]  Note: This document was prepared with digital dictation and possible smart phrase technology. Any transcriptional errors that result from this process are unintentional.

## 2013-12-24 NOTE — Patient Instructions (Signed)
The patient continues to do well on the [place], and this medication will be continued. The patient will followup in one year. A prescription was sent in to her pharmacy, [facility].  Disposition: return to clinic in 1 year

## 2014-01-22 ENCOUNTER — Telehealth: Payer: Self-pay | Admitting: Internal Medicine

## 2014-01-22 NOTE — Telephone Encounter (Signed)
New problem       Pt's daughter called with questions and concerns about mother's erratic BP around 117/50.  Pt is not feeling well at all.   Daughter will call pt's PCP as well.  Pt's daughter is concerned with the interactions with the pacer and her erratic BP.  Please give her a call back.

## 2014-01-22 NOTE — Telephone Encounter (Signed)
Spoke with daughter and her other sister is calling Dr Forde Dandy to discuss as he has been the one following her BP.  She says her moms BP is going up and down but more recently has been up, as high as 180/ She is taking Benazepril 20mg  daily

## 2014-01-27 ENCOUNTER — Telehealth: Payer: Self-pay | Admitting: Internal Medicine

## 2014-01-27 NOTE — Telephone Encounter (Signed)
Pt of Dr. Quinn Plowman by daughter, Danielle Roth Patient was seen by her primary care office, [facility], on Friday and diagnosed with possible fecal impaction. This was in the setting of a spontaneous episode of fecal incontinence which occurred in the middle the night while the patient was sleeping the night before she was seen by PCP Primary care recommended enemas, which have been given by the patient's home health nurse She is receiving 9 fleets enemas over the weekend without significant bowel movement Daughter is calling now for advice.  The daughter states her mother does not seem to be any distress or pain. No nausea or vomiting. Her appetite has been poor but this is not necessarily new. She has been sleeping but she states that this is her mother's typical response to stress.  No rectal bleeding or melena. No fevers or chills.  I recommended an ER visit if the patient develops significant pain, fever, or is in any obvious discomfort I also recommended they discontinue enemas and do not continue this treatment as it is not helping I recommended MiraLax 17 g daily by mouth to try to stimulate bowel movement from above.  She was previously on this medication but off for the last 3 or 4 months Otherwise the office can check on her tomorrow and offer acute visit if felt necessary

## 2014-01-27 NOTE — Telephone Encounter (Signed)
Daughter, Mercy Moore, requested that she be a contact person for her mother at [phone]

## 2014-01-27 NOTE — Telephone Encounter (Signed)
error 

## 2014-01-28 ENCOUNTER — Encounter: Payer: Self-pay | Admitting: *Deleted

## 2014-01-28 ENCOUNTER — Ambulatory Visit (INDEPENDENT_AMBULATORY_CARE_PROVIDER_SITE_OTHER): Payer: Medicare Other | Admitting: Internal Medicine

## 2014-01-28 VITALS — BP 100/56 | HR 72 | Ht 62.5 in

## 2014-01-28 DIAGNOSIS — R159 Full incontinence of feces: Secondary | ICD-10-CM

## 2014-01-28 NOTE — Patient Instructions (Signed)
Please purchase the following medications over the counter and take as directed: Miralax-Take 1 capful daily. You may titrate this until you have 1-2 bowel movements daily

## 2014-01-28 NOTE — Telephone Encounter (Signed)
Spoke with pts daughter and she states the pt has not had a bowel movement yet and they would like to be seen. Pt scheduled to be seen today at 3:15pm. Pts daughter aware.

## 2014-01-28 NOTE — Progress Notes (Signed)
HISTORY OF PRESENT ILLNESS:  Danielle Roth is a 80 y.o. female with multiple medical problems including hypertension, hyperlipidemia, prior stroke, breast cancer, glaucoma, gout, paroxysmal atrial fibrillation on Coumadin, and coronary artery disease. The patient was evaluated in this office 10/28/2009 regarding problems with coughing and choking spells and possible dysphagia. Previous upper endoscopy with [name] in 2002 was negative except for gastritis. Thus, the patient underwent a barium esophagram which revealed a prominent cricopharyngeus, tertiary contractions, and moderate reflux. Barium pill passed without impedance. She was seen by speech pathology and recommendations made. Through his current problem is that of an episode of fecal incontinence at her nursing home 4 days ago. She was seen by a nurse practitioner at [name] office 3 days ago. She was said to have a fecal impaction with overflow incontinence. Multiple enemas recommended. Apparently no significant results. The daughter contacted my partner on call this weekend. MiraLax recommended. They have had some doses without results. They schedule this appointment. There has been no abdominal pain or bleeding. She did undergo complete colonoscopy with [name] in 2002. This was said to be normal.  REVIEW OF SYSTEMS:  All non-GI ROS negative except for fatigue, restless nights and recurrent UTIs  Past Medical History  Diagnosis Date  . Breast cancer     left mastectomy  . Glaucoma   . Hyperlipidemia   . Hypertension   . Stroke   . Paroxysmal atrial fibrillation     on coumadin  . Gout   . SSS (sick sinus syndrome) 2007    PPM 2007 by Dr. Verlon Setting  . Pulmonary embolus     on coumadin  . Partial seizures   . Cerebrovascular disease   . Myocardial infarction   . Degenerative arthritis   . GERD (gastroesophageal reflux disease)   . Helicobacter pylori (H. pylori) 2002    Past Surgical History  Procedure Laterality Date   . Mastectomy Left   . Tonsillectomy    . Pacemaker insertion  2007    dual-chamber permanent pacemaker implantation by [name] MDT    Social History [place] [facility]  reports that she has never smoked. She has never used smokeless tobacco. She reports that she does not drink alcohol or use illicit drugs.  family history includes Heart disease in her father and mother; Kidney disease in her mother. There is no history of Colon cancer.  Allergies  Allergen Reactions  . Diclofenac Sodium     The gel  . Iodine   . Other Nausea And Vomiting    Walnuts  . Sulfonamide Derivatives   . Codeine Nausea And Vomiting and Rash       PHYSICAL EXAMINATION: Vital signs: BP 100/56  Pulse 72  Ht 5' 2.5" (1.588 m)  Constitutional: Elderly frail -appearing, no acute distress.  Psychiatric: alert and oriented x3, cooperative Eyes: extraocular movements intact, anicteric, conjunctiva pink Mouth: oral pharynx moist, no lesions Neck: supple no lymphadenopathy Cardiovascular: heart regular rate and rhythm, no murmur Lungs: clear to auscultation bilaterally Abdomen: soft, nontender, nondistended, no obvious ascites, no peritoneal signs, normal bowel sounds, no organomegaly Rectal: Decreased rectal tone. No stool or mass the rectal vault. Minor red blood Extremities: no lower extremity edema bilaterally Skin: no lesions on visible extremities Neuro: Generalized weakness.   ASSESSMENT:  #1. Isolated episode of fecal incontinence 4 days ago when a 80 year old with multiple medical problems. Presumably diagnosed with fecal impaction diagnosed by KUB, not physical exam. On today's examination no evidence  of impaction with benign soft abdomen. There is decreased rectal tone. Minor bleeding likely due to enema trauma   PLAN:  #1. Resume previous diet. #2. One dose of MiraLax daily to achieve 1-2 bowel movements. If incontinence ongoing, she will need protective undergarments #3. Resume general  medical care with [name]

## 2014-02-06 ENCOUNTER — Encounter: Payer: Self-pay | Admitting: Internal Medicine

## 2014-02-06 ENCOUNTER — Ambulatory Visit (INDEPENDENT_AMBULATORY_CARE_PROVIDER_SITE_OTHER): Payer: Medicare Other | Admitting: Internal Medicine

## 2014-02-06 VITALS — BP 108/72 | HR 85 | Ht 62.5 in

## 2014-02-06 DIAGNOSIS — I495 Sick sinus syndrome: Secondary | ICD-10-CM

## 2014-02-06 DIAGNOSIS — I4891 Unspecified atrial fibrillation: Secondary | ICD-10-CM

## 2014-02-06 DIAGNOSIS — I498 Other specified cardiac arrhythmias: Secondary | ICD-10-CM

## 2014-02-06 DIAGNOSIS — I10 Essential (primary) hypertension: Secondary | ICD-10-CM

## 2014-02-06 LAB — MDC_IDC_ENUM_SESS_TYPE_INCLINIC
Battery Voltage: 2.96 V
Brady Statistic AP VP Percent: 0.02 %
Brady Statistic AP VS Percent: 37.01 %
Brady Statistic AS VP Percent: 0.02 %
Brady Statistic AS VS Percent: 62.95 %
Brady Statistic RA Percent Paced: 37.03 %
Date Time Interrogation Session: 20150218145043
Lead Channel Impedance Value: 504 Ohm
Lead Channel Pacing Threshold Amplitude: 1 V
Lead Channel Pacing Threshold Amplitude: 1 V
Lead Channel Sensing Intrinsic Amplitude: 14.6 mV
Lead Channel Setting Pacing Amplitude: 2.5 V
MDC IDC MSMT LEADCHNL RA IMPEDANCE VALUE: 348 Ohm
MDC IDC MSMT LEADCHNL RA PACING THRESHOLD PULSEWIDTH: 0.4 ms
MDC IDC MSMT LEADCHNL RA SENSING INTR AMPL: 3.3 mV
MDC IDC MSMT LEADCHNL RV PACING THRESHOLD PULSEWIDTH: 0.4 ms
MDC IDC SET LEADCHNL RA PACING AMPLITUDE: 2 V
MDC IDC SET LEADCHNL RV PACING PULSEWIDTH: 0.4 ms
MDC IDC SET LEADCHNL RV SENSING SENSITIVITY: 0.9 mV
MDC IDC STAT BRADY RV PERCENT PACED: 0.03 %
Zone Setting Detection Interval: 400 ms
Zone Setting Detection Interval: 400 ms

## 2014-02-06 NOTE — Patient Instructions (Addendum)
Your physician wants you to follow-up in: 12 months with Dr Vallery Ridge will receive a reminder letter in the mail two months in advance. If you don't receive a letter, please call our office to schedule the follow-up appointment.   Remote monitoring is used to monitor your Pacemaker or ICD from home. This monitoring reduces the number of office visits required to check your device to one time per year. It allows [place] to keep an eye on the functioning of your device to ensure it is working properly. You are scheduled for a device check from home on 05/06/14. You may send your transmission at any time that day. If you have a wireless device, the transmission will be sent automatically. After your physician reviews your transmission, you will receive a postcard with your next transmission date.

## 2014-02-10 DIAGNOSIS — I495 Sick sinus syndrome: Secondary | ICD-10-CM | POA: Insufficient documentation

## 2014-02-10 NOTE — Progress Notes (Signed)
PCP:  Sheela Stack, MD  The patient presents today for routine electrophysiology followup.  Since last being seen in our clinic, the patient reports doing reasonably well. Her BP has been labile.  She is following up with Dr Forde Dandy for this.  Today, she denies symptoms of palpitations, chest pain, shortness of breath, orthopnea, PND, lower extremity edema, dizziness, presyncope, syncope, or neurologic sequela.  The patient feels that she is tolerating medications without difficulties and is otherwise without complaint today.   Past Medical History  Diagnosis Date  . Breast cancer     left mastectomy  . Glaucoma   . Hyperlipidemia   . Hypertension   . Stroke   . Paroxysmal atrial fibrillation     on coumadin  . Gout   . SSS (sick sinus syndrome) 2007    PPM 2007 by Dr. Verlon Setting  . Pulmonary embolus     on coumadin  . Partial seizures   . Cerebrovascular disease   . Myocardial infarction   . Degenerative arthritis   . GERD (gastroesophageal reflux disease)   . Helicobacter pylori (H. pylori) 2002   Past Surgical History  Procedure Laterality Date  . Mastectomy Left   . Tonsillectomy    . Pacemaker insertion  2007    dual-chamber permanent pacemaker implantation by [name] MDT    Current Outpatient Prescriptions  Medication Sig Dispense Refill  . allopurinol (ZYLOPRIM) 300 MG tablet Take 300 mg by mouth daily.        . benazepril (LOTENSIN) 5 MG tablet Take 5 mg by mouth daily.      . bimatoprost (LUMIGAN) 0.03 % ophthalmic solution Place 1 drop into both eyes at bedtime.       . hydrochlorothiazide (MICROZIDE) 12.5 MG capsule Take 12.5 mg by mouth daily.      Marland Kitchen levETIRAcetam (KEPPRA) 250 MG tablet Take 1 tablet (250 mg total) by mouth every 12 (twelve) hours.  180 tablet  3  . Multiple Vitamin (MULTIVITAMIN) tablet Take 1 tablet by mouth daily.        Marland Kitchen omeprazole (PRILOSEC) 20 MG capsule Take 20 mg by mouth daily.        . pravastatin (PRAVACHOL) 40 MG tablet Take 40 mg  by mouth daily.        . Timolol Maleate 0.5 % (DAILY) SOLN Apply to eye 2 (two) times daily.        . Trospium Chloride 60 MG CP24 Take 60 mg by mouth 2 (two) times daily.      Marland Kitchen warfarin (COUMADIN) 2 MG tablet Take 2 mg by mouth 4 (four) times a week. *Sunday, Tuesday, Wednesday, and Friday      . warfarin (COUMADIN) 3 MG tablet Take 3 mg by mouth 3 (three) times a week. *on Monday, Thursday, and Saturday       No current facility-administered medications for this visit.    Allergies  Allergen Reactions  . Diclofenac Sodium     The gel  . Iodine   . Other Nausea And Vomiting    Walnuts  . Sulfonamide Derivatives   . Codeine Nausea And Vomiting and Rash    History   Social History  . Marital Status: Widowed    Spouse Name: N/A    Number of Children: N/A  . Years of Education: N/A   Occupational History  . retired    Social History Main Topics  . Smoking status: Never Smoker   . Smokeless tobacco: Never Used  .  Alcohol Use: No  . Drug Use: No  . Sexual Activity: Not on file   Other Topics Concern  . Not on file   Social History Narrative   Patient lives in assisted living in [place] at [facility].   Retired Licensed conveyancer.   Previously expressed wishes for DNI/DNR    Family History  Problem Relation Age of Onset  . Heart disease Mother   . Kidney disease Mother   . Heart disease Father   . Colon cancer Neg Hx    Physical Exam: Filed Vitals:   02/06/14 1147  BP: 108/72  Pulse: 85  Height: 5' 2.5" (1.588 m)    GEN- The patient is elderly appearing, alert and oriented x 3 today.   Head- normocephalic, atraumatic Eyes-  Sclera clear, conjunctiva pink Ears- hearing intact Oropharynx- clear Neck- supple Lungs- Clear to ausculation bilaterally, normal work of breathing Chest- pacemaker pocket is well healed Heart- Regular rate and rhythm, no murmurs, rubs or gallops, PMI not laterally displaced GI- soft, NT, ND, + BS Extremities- no clubbing, cyanosis, or  edema  Pacemaker interrogation- reviewed in detail today,  See PACEART report  Assessment and Plan:  1. Sick sinus syndrome Normal pacemaker function See Pace Art report No changes today  2. afib Reasonably well controlled Asymptomatic chads2vasc score is at least 6.  She also has a h/o PTE She should therefore continue longterm anticoagulation  3. HTN Labile at times She will follow closely with Dr Forde Dandy No changes today  carelink Return in 1 year

## 2014-05-03 ENCOUNTER — Telehealth: Payer: Self-pay | Admitting: Cardiology

## 2014-05-03 NOTE — Telephone Encounter (Signed)
Pt daughter called and stated that she is going to send remote transmission on Saturday 5-16 instead of Sunday 5-17. Pt daughter stated that if transmission is not on carelink website on Monday to call her and she will resend transmissionEmily Gerilyn Roth [phone]

## 2014-05-04 ENCOUNTER — Encounter: Payer: Self-pay | Admitting: Internal Medicine

## 2014-05-04 LAB — MDC_IDC_ENUM_SESS_TYPE_REMOTE
Battery Voltage: 2.95 V
Brady Statistic AS VS Percent: 64.83 %
Lead Channel Impedance Value: 344 Ohm
Lead Channel Impedance Value: 488 Ohm
Lead Channel Sensing Intrinsic Amplitude: 2.2972
Lead Channel Sensing Intrinsic Amplitude: 9.0189
Lead Channel Setting Pacing Amplitude: 2 V
Lead Channel Setting Pacing Amplitude: 2.5 V
Lead Channel Setting Pacing Pulse Width: 0.4 ms
MDC IDC SESS DTM: 20150516230717
MDC IDC SET LEADCHNL RV SENSING SENSITIVITY: 0.9 mV
MDC IDC SET ZONE DETECTION INTERVAL: 400 ms
MDC IDC SET ZONE DETECTION INTERVAL: 400 ms
MDC IDC STAT BRADY AP VP PERCENT: 0.01 %
MDC IDC STAT BRADY AP VS PERCENT: 35.16 %
MDC IDC STAT BRADY AS VP PERCENT: 0.01 %
MDC IDC STAT BRADY RA PERCENT PACED: 35.16 %
MDC IDC STAT BRADY RV PERCENT PACED: 0.02 %

## 2014-05-06 ENCOUNTER — Ambulatory Visit (INDEPENDENT_AMBULATORY_CARE_PROVIDER_SITE_OTHER): Payer: Medicare Other | Admitting: *Deleted

## 2014-05-06 DIAGNOSIS — I495 Sick sinus syndrome: Secondary | ICD-10-CM

## 2014-05-06 NOTE — Progress Notes (Signed)
Remote pacemaker transmission.   

## 2014-05-07 ENCOUNTER — Telehealth: Payer: Self-pay | Admitting: Internal Medicine

## 2014-05-07 NOTE — Telephone Encounter (Signed)
Left message with Katy(daughter) that it is ok for her mom to ride in her car and no damage will be done to the pacemaker.

## 2014-05-07 NOTE — Telephone Encounter (Signed)
New message     Daughter has a new car and was told that people with pacemakers may not be able to ride in the car because of the way the key is made---it may "hurt" the pacemaker.  She want to know if her mom will be able to ride in her car

## 2014-07-16 ENCOUNTER — Encounter (HOSPITAL_COMMUNITY): Payer: Self-pay | Admitting: Emergency Medicine

## 2014-07-16 ENCOUNTER — Emergency Department (HOSPITAL_COMMUNITY): Payer: Medicare Other

## 2014-07-16 ENCOUNTER — Inpatient Hospital Stay (HOSPITAL_COMMUNITY)
Admission: EM | Admit: 2014-07-16 | Discharge: 2014-07-20 | DRG: 194 | Disposition: A | Discharge status: Skilled Nursing Facility | Payer: Medicare Other | Attending: Endocrinology | Admitting: Endocrinology

## 2014-07-16 DIAGNOSIS — Z8673 Personal history of transient ischemic attack (TIA), and cerebral infarction without residual deficits: Secondary | ICD-10-CM

## 2014-07-16 DIAGNOSIS — Z86711 Personal history of pulmonary embolism: Secondary | ICD-10-CM | POA: Diagnosis not present

## 2014-07-16 DIAGNOSIS — K219 Gastro-esophageal reflux disease without esophagitis: Secondary | ICD-10-CM | POA: Diagnosis present

## 2014-07-16 DIAGNOSIS — J9819 Other pulmonary collapse: Secondary | ICD-10-CM | POA: Diagnosis present

## 2014-07-16 DIAGNOSIS — I2510 Atherosclerotic heart disease of native coronary artery without angina pectoris: Secondary | ICD-10-CM | POA: Diagnosis present

## 2014-07-16 DIAGNOSIS — G40909 Epilepsy, unspecified, not intractable, without status epilepticus: Secondary | ICD-10-CM | POA: Diagnosis present

## 2014-07-16 DIAGNOSIS — W19XXXA Unspecified fall, initial encounter: Secondary | ICD-10-CM | POA: Diagnosis present

## 2014-07-16 DIAGNOSIS — I10 Essential (primary) hypertension: Secondary | ICD-10-CM | POA: Diagnosis present

## 2014-07-16 DIAGNOSIS — IMO0002 Reserved for concepts with insufficient information to code with codable children: Secondary | ICD-10-CM | POA: Diagnosis not present

## 2014-07-16 DIAGNOSIS — Z7901 Long term (current) use of anticoagulants: Secondary | ICD-10-CM | POA: Diagnosis not present

## 2014-07-16 DIAGNOSIS — D649 Anemia, unspecified: Secondary | ICD-10-CM | POA: Diagnosis present

## 2014-07-16 DIAGNOSIS — Z9010 Acquired absence of unspecified breast and nipple: Secondary | ICD-10-CM | POA: Diagnosis not present

## 2014-07-16 DIAGNOSIS — R4182 Altered mental status, unspecified: Secondary | ICD-10-CM | POA: Diagnosis present

## 2014-07-16 DIAGNOSIS — H409 Unspecified glaucoma: Secondary | ICD-10-CM | POA: Diagnosis present

## 2014-07-16 DIAGNOSIS — Z950 Presence of cardiac pacemaker: Secondary | ICD-10-CM | POA: Diagnosis not present

## 2014-07-16 DIAGNOSIS — N390 Urinary tract infection, site not specified: Secondary | ICD-10-CM | POA: Diagnosis present

## 2014-07-16 DIAGNOSIS — I4891 Unspecified atrial fibrillation: Secondary | ICD-10-CM | POA: Diagnosis present

## 2014-07-16 DIAGNOSIS — R410 Disorientation, unspecified: Secondary | ICD-10-CM

## 2014-07-16 DIAGNOSIS — M109 Gout, unspecified: Secondary | ICD-10-CM | POA: Diagnosis present

## 2014-07-16 DIAGNOSIS — E46 Unspecified protein-calorie malnutrition: Secondary | ICD-10-CM | POA: Diagnosis present

## 2014-07-16 DIAGNOSIS — I252 Old myocardial infarction: Secondary | ICD-10-CM

## 2014-07-16 DIAGNOSIS — Y9210 Unspecified residential institution as the place of occurrence of the external cause: Secondary | ICD-10-CM | POA: Diagnosis present

## 2014-07-16 DIAGNOSIS — J189 Pneumonia, unspecified organism: Secondary | ICD-10-CM | POA: Diagnosis present

## 2014-07-16 DIAGNOSIS — E785 Hyperlipidemia, unspecified: Secondary | ICD-10-CM | POA: Diagnosis present

## 2014-07-16 DIAGNOSIS — R7309 Other abnormal glucose: Secondary | ICD-10-CM | POA: Diagnosis present

## 2014-07-16 DIAGNOSIS — Z66 Do not resuscitate: Secondary | ICD-10-CM

## 2014-07-16 DIAGNOSIS — Z853 Personal history of malignant neoplasm of breast: Secondary | ICD-10-CM

## 2014-07-16 LAB — PROTIME-INR
INR: 1.7 — ABNORMAL HIGH (ref 0.00–1.49)
Prothrombin Time: 20 seconds — ABNORMAL HIGH (ref 11.6–15.2)

## 2014-07-16 LAB — COMPREHENSIVE METABOLIC PANEL
ALK PHOS: 122 U/L — AB (ref 39–117)
ALT: 10 U/L (ref 0–35)
AST: 75 U/L — ABNORMAL HIGH (ref 0–37)
Albumin: 3.1 g/dL — ABNORMAL LOW (ref 3.5–5.2)
Anion gap: 12 (ref 5–15)
BILIRUBIN TOTAL: 1 mg/dL (ref 0.3–1.2)
BUN: 15 mg/dL (ref 6–23)
CALCIUM: 9.4 mg/dL (ref 8.4–10.5)
CO2: 22 mEq/L (ref 19–32)
Chloride: 105 mEq/L (ref 96–112)
Creatinine, Ser: 0.94 mg/dL (ref 0.50–1.10)
GFR calc Af Amer: 59 mL/min — ABNORMAL LOW (ref >90)
GFR, EST NON AFRICAN AMERICAN: 51 mL/min — AB (ref >90)
GLUCOSE: 124 mg/dL — AB (ref 70–99)
Potassium: 4 mEq/L (ref 3.7–5.3)
Sodium: 139 mEq/L (ref 137–147)
TOTAL PROTEIN: 6.1 g/dL (ref 6.0–8.3)

## 2014-07-16 LAB — URINALYSIS, ROUTINE W REFLEX MICROSCOPIC
BILIRUBIN URINE: NEGATIVE
GLUCOSE, UA: NEGATIVE mg/dL
Hgb urine dipstick: NEGATIVE
KETONES UR: NEGATIVE mg/dL
Nitrite: NEGATIVE
Protein, ur: 30 mg/dL — AB
Specific Gravity, Urine: 1.017 (ref 1.005–1.030)
Urobilinogen, UA: 2 mg/dL — ABNORMAL HIGH (ref 0.0–1.0)
pH: 5.5 (ref 5.0–8.0)

## 2014-07-16 LAB — CBC WITH DIFFERENTIAL/PLATELET
Basophils Absolute: 0 10*3/uL (ref 0.0–0.1)
Basophils Relative: 0 % (ref 0–1)
EOS ABS: 0 10*3/uL (ref 0.0–0.7)
EOS PCT: 0 % (ref 0–5)
HCT: 33.8 % — ABNORMAL LOW (ref 36.0–46.0)
Hemoglobin: 11.1 g/dL — ABNORMAL LOW (ref 12.0–15.0)
LYMPHS ABS: 1.2 10*3/uL (ref 0.7–4.0)
Lymphocytes Relative: 17 % (ref 12–46)
MCH: 31.8 pg (ref 26.0–34.0)
MCHC: 32.8 g/dL (ref 30.0–36.0)
MCV: 96.8 fL (ref 78.0–100.0)
Monocytes Absolute: 1.1 10*3/uL — ABNORMAL HIGH (ref 0.1–1.0)
Monocytes Relative: 15 % — ABNORMAL HIGH (ref 3–12)
Neutro Abs: 4.9 10*3/uL (ref 1.7–7.7)
Neutrophils Relative %: 68 % (ref 43–77)
PLATELETS: 132 10*3/uL — AB (ref 150–400)
RBC: 3.49 MIL/uL — ABNORMAL LOW (ref 3.87–5.11)
RDW: 14.8 % (ref 11.5–15.5)
WBC: 7.2 10*3/uL (ref 4.0–10.5)

## 2014-07-16 LAB — URINE MICROSCOPIC-ADD ON

## 2014-07-16 MED ORDER — PANTOPRAZOLE SODIUM 40 MG PO TBEC
40.0000 mg | DELAYED_RELEASE_TABLET | Freq: Every day | ORAL | Status: DC
  Administered 2014-07-17 – 2014-07-20 (×4): 40 mg via ORAL
  Filled 2014-07-16 (×3): qty 1

## 2014-07-16 MED ORDER — WARFARIN SODIUM 2 MG PO TABS
2.0000 mg | ORAL_TABLET | ORAL | Status: DC
  Filled 2014-07-16: qty 1

## 2014-07-16 MED ORDER — VANCOMYCIN HCL IN DEXTROSE 1-5 GM/200ML-% IV SOLN
1000.0000 mg | INTRAVENOUS | Status: DC
  Filled 2014-07-16: qty 200

## 2014-07-16 MED ORDER — LEVETIRACETAM 250 MG PO TABS
250.0000 mg | ORAL_TABLET | Freq: Two times a day (BID) | ORAL | Status: DC
  Administered 2014-07-16 – 2014-07-20 (×8): 250 mg via ORAL
  Filled 2014-07-16 (×9): qty 1

## 2014-07-16 MED ORDER — ONDANSETRON HCL 4 MG PO TABS: 4.0000 mg | ORAL_TABLET | Freq: Four times a day (QID) | ORAL | Status: DC | PRN

## 2014-07-16 MED ORDER — ALUM & MAG HYDROXIDE-SIMETH 200-200-20 MG/5ML PO SUSP: 30.0000 mL | Freq: Four times a day (QID) | ORAL | Status: DC | PRN

## 2014-07-16 MED ORDER — LATANOPROST 0.005 % OP SOLN
1.0000 [drp] | Freq: Every day | OPHTHALMIC | Status: DC
  Administered 2014-07-16 – 2014-07-19 (×5): 1 [drp] via OPHTHALMIC
  Filled 2014-07-16: qty 2.5

## 2014-07-16 MED ORDER — ALLOPURINOL 300 MG PO TABS
300.0000 mg | ORAL_TABLET | Freq: Every day | ORAL | Status: DC
  Administered 2014-07-17 – 2014-07-20 (×4): 300 mg via ORAL
  Filled 2014-07-16 (×4): qty 1

## 2014-07-16 MED ORDER — DARIFENACIN HYDROBROMIDE ER 7.5 MG PO TB24
7.5000 mg | ORAL_TABLET | Freq: Every day | ORAL | Status: DC
  Administered 2014-07-17 – 2014-07-20 (×4): 7.5 mg via ORAL
  Filled 2014-07-16 (×4): qty 1

## 2014-07-16 MED ORDER — ACETAMINOPHEN 650 MG RE SUPP: 650.0000 mg | Freq: Four times a day (QID) | RECTAL | Status: DC | PRN

## 2014-07-16 MED ORDER — WARFARIN SODIUM 3 MG PO TABS: 3.0000 mg | ORAL_TABLET | ORAL | Status: DC

## 2014-07-16 MED ORDER — POLYETHYLENE GLYCOL 3350 17 G PO PACK
17.0000 g | PACK | Freq: Every day | ORAL | Status: DC | PRN
  Filled 2014-07-16: qty 1

## 2014-07-16 MED ORDER — ALBUTEROL SULFATE (2.5 MG/3ML) 0.083% IN NEBU: 2.5000 mg | INHALATION_SOLUTION | RESPIRATORY_TRACT | Status: DC | PRN

## 2014-07-16 MED ORDER — WARFARIN SODIUM 2 MG PO TABS: 2.0000 mg | ORAL_TABLET | ORAL | Status: DC

## 2014-07-16 MED ORDER — APIXABAN 2.5 MG PO TABS
2.5000 mg | ORAL_TABLET | Freq: Two times a day (BID) | ORAL | Status: DC
  Administered 2014-07-17 – 2014-07-20 (×8): 2.5 mg via ORAL
  Filled 2014-07-16 (×9): qty 1

## 2014-07-16 MED ORDER — PIPERACILLIN-TAZOBACTAM 3.375 G IVPB
3.3750 g | Freq: Once | INTRAVENOUS | Status: AC
  Administered 2014-07-16: 3.375 g via INTRAVENOUS
  Filled 2014-07-16: qty 50

## 2014-07-16 MED ORDER — DOCUSATE SODIUM 100 MG PO CAPS
100.0000 mg | ORAL_CAPSULE | Freq: Two times a day (BID) | ORAL | Status: DC
  Administered 2014-07-16 – 2014-07-20 (×8): 100 mg via ORAL
  Filled 2014-07-16 (×9): qty 1

## 2014-07-16 MED ORDER — PIPERACILLIN-TAZOBACTAM 3.375 G IVPB
3.3750 g | Freq: Three times a day (TID) | INTRAVENOUS | Status: DC
  Administered 2014-07-17 – 2014-07-20 (×10): 3.375 g via INTRAVENOUS
  Filled 2014-07-16 (×12): qty 50

## 2014-07-16 MED ORDER — ZOLPIDEM TARTRATE 5 MG PO TABS: 5.0000 mg | ORAL_TABLET | Freq: Every evening | ORAL | Status: DC | PRN

## 2014-07-16 MED ORDER — BENAZEPRIL HCL 5 MG PO TABS
5.0000 mg | ORAL_TABLET | Freq: Every day | ORAL | Status: DC
  Administered 2014-07-17 – 2014-07-20 (×4): 5 mg via ORAL
  Filled 2014-07-16 (×4): qty 1

## 2014-07-16 MED ORDER — SIMVASTATIN 20 MG PO TABS
20.0000 mg | ORAL_TABLET | Freq: Every day | ORAL | Status: DC
  Administered 2014-07-17 – 2014-07-19 (×3): 20 mg via ORAL
  Filled 2014-07-16 (×4): qty 1

## 2014-07-16 MED ORDER — HYDROCHLOROTHIAZIDE 12.5 MG PO CAPS
12.5000 mg | ORAL_CAPSULE | ORAL | Status: DC
  Administered 2014-07-17: 12.5 mg via ORAL
  Filled 2014-07-16 (×2): qty 1

## 2014-07-16 MED ORDER — WARFARIN - PHARMACIST DOSING INPATIENT: Freq: Every day | Status: DC

## 2014-07-16 MED ORDER — ACETAMINOPHEN 325 MG PO TABS
650.0000 mg | ORAL_TABLET | Freq: Four times a day (QID) | ORAL | Status: DC | PRN
  Administered 2014-07-19 – 2014-07-20 (×3): 650 mg via ORAL
  Filled 2014-07-16 (×3): qty 2

## 2014-07-16 MED ORDER — ONDANSETRON HCL 4 MG/2ML IJ SOLN: 4.0000 mg | Freq: Four times a day (QID) | INTRAMUSCULAR | Status: DC | PRN

## 2014-07-16 MED ORDER — TIMOLOL MALEATE 0.5 % OP SOLN
1.0000 [drp] | Freq: Two times a day (BID) | OPHTHALMIC | Status: DC
  Administered 2014-07-16 – 2014-07-20 (×8): 1 [drp] via OPHTHALMIC
  Filled 2014-07-16: qty 5

## 2014-07-16 MED ORDER — SODIUM CHLORIDE 0.9 % IJ SOLN
3.0000 mL | Freq: Two times a day (BID) | INTRAMUSCULAR | Status: DC
  Administered 2014-07-19 – 2014-07-20 (×3): 3 mL via INTRAVENOUS

## 2014-07-16 MED ORDER — POTASSIUM CHLORIDE IN NACL 20-0.9 MEQ/L-% IV SOLN
INTRAVENOUS | Status: DC
Start: 2014-07-16 — End: 2014-07-20
  Administered 2014-07-16: 75 mL/h via INTRAVENOUS
  Administered 2014-07-17 – 2014-07-20 (×3): via INTRAVENOUS
  Filled 2014-07-16 (×8): qty 1000

## 2014-07-16 MED ORDER — VANCOMYCIN HCL IN DEXTROSE 1-5 GM/200ML-% IV SOLN
1000.0000 mg | Freq: Once | INTRAVENOUS | Status: AC
  Administered 2014-07-16: 1000 mg via INTRAVENOUS
  Filled 2014-07-16: qty 200

## 2014-07-16 NOTE — Progress Notes (Signed)
Received patient assignment.

## 2014-07-16 NOTE — Progress Notes (Signed)
ANTIBIOTIC CONSULT NOTE - INITIAL  Pharmacy Consult:  Vancomycin / Zosyn Indication:  Rule out aspiration PNA  Allergies  Allergen Reactions  . Diclofenac Sodium     The gel  . Iodine     topical  . Other Nausea And Vomiting    Walnuts  . Sulfonamide Derivatives     unknown  . Codeine Nausea And Vomiting and Rash    Patient Measurements: Height: 5' 2.6" (159 cm) Weight: 149 lb 0.5 oz (67.6 kg) IBW/kg (Calculated) : 51.48  Vital Signs: Temp: 97.2 F (36.2 C) (07/28 1737) Temp src: Oral (07/28 1530) BP: 132/54 mmHg (07/28 1730) Pulse Rate: 91 (07/28 1730)  Labs:  Recent Labs  07/16/14 1538  WBC 7.2  HGB 11.1*  PLT 132*  CREATININE 0.94   Estimated Creatinine Clearance: 34.2 ml/min (by C-G formula based on Cr of 0.94). No results found for this basename: VANCOTROUGH, VANCOPEAK, VANCORANDOM, GENTTROUGH, GENTPEAK, GENTRANDOM, TOBRATROUGH, TOBRAPEAK, TOBRARND, AMIKACINPEAK, AMIKACINTROU, AMIKACIN,  in the last 72 hours   Microbiology: No results found for this or any previous visit (from the past 720 hour(s)).  Medical History: Past Medical History  Diagnosis Date  . Glaucoma   . Hyperlipidemia   . Hypertension   . Stroke   . Paroxysmal atrial fibrillation     on coumadin  . Gout   . SSS (sick sinus syndrome) 2007    PPM 2007 by Dr. Verlon Setting  . Pulmonary embolus     on coumadin  . Partial seizures   . Cerebrovascular disease   . Myocardial infarction   . Degenerative arthritis   . GERD (gastroesophageal reflux disease)   . Helicobacter pylori (H. pylori) 2002  . Breast cancer     left mastectomy      Assessment: 38 YOF admitted from ALF with AMS, s/p fall yesterday 07/15/14.  Pharmacy consulted to initiate broad spectrum antibiotics for possible aspiration PNA.  Baseline labs and home meds reviewed.   Goal of Therapy:  Vancomycin trough level 15-20 mcg/ml   Plan:  - Vanc 1gm IV Q24H - Zosyn 3.375gm IV Q8H, 4 hr infusion - Monitor renal fxn,  clinical course, vanc trough as indicated - F/U CT result and resume Coumadin when appropriate    Yonael Tulloch D. Mina Marble, PharmD, BCPS Pager:  [phone] 07/16/2014, 5:53 PM

## 2014-07-16 NOTE — Progress Notes (Signed)
Got report from ED at 1844. Awaiting patient arrival to floor.

## 2014-07-16 NOTE — ED Notes (Addendum)
Pt presents from [facility] via [facility] with c/o Altered Mental Status x1 day. Per EMS pt's daughter reported that patient fell yesterday and did not receive medical attention because she was feeling fine at that time. Today when pt's daughter checked on her, she was experiencing visual and auditory hallucinations and had dry oral mucousa. EMS gave 74mL NS IV and patient is now more alert, oriented too self, and place, and is interactive.

## 2014-07-16 NOTE — Progress Notes (Signed)
PHARMACIST - PHYSICIAN COMMUNICATION  Assessment: The patient's daughter reports that coumadin was discontinued some time ago and Eliquis (apixaban) was started on 03/27/14 by [name] as recommended by Dr. Rayann Heman, her cardiologist.  I have discussed her prior to admit medications with her daughter and viewed the update medication list.  The patient was taking Eliquis 2.5 mg po BID (appropriate dose for 80 y.o, wt=61kg). I called Dr. Osborne Casco and he has given orders to discontinue the coumadin and resume her eliquis.   Her PTA medication reconciliation list has been updated.    Plan:  Discontinue coumadin (none given this admit) Resume eliquis 2.5 mg po BID.  Nicole Cella, RPh Clinical Pharmacist Pager: [phone] 07/16/2014,11:16 PM

## 2014-07-16 NOTE — ED Provider Notes (Signed)
I saw and evaluated the patient, reviewed the resident's note and I agree with the findings and plan.  Please see separate associated note for evaluation and plan.   EKG Interpretation None      Patient presented to the ER with confusion, hallucinations, mental status changes. Patient also had a fall yesterday, and circumstances unclear. She is on Coumadin, sent to the ER for further evaluation.  Face to face Exam: HEENT - PERRLA Lungs - CTAB Heart - RRR, no M/R/G Abd - S/NT/ND Neuro - alert, oriented x3  Plan: CT scan head performed to rule out intracranial bleed from fall and anticoagulation. No acute findings. Chest x-ray shows right upper lobe infiltrate, aspiration versus healthcare associated pneumonia. Patient is not significantly hypoxic and is not septic, but still is confused from her baseline, appears weak and will require hospitalization for further management.   Orpah Greek, MD 07/16/14 [phone]

## 2014-07-16 NOTE — ED Notes (Signed)
Family states pt fell yesterday

## 2014-07-16 NOTE — ED Provider Notes (Signed)
CSN: [id number]     Arrival date & time 07/16/14  1519 History   First MD Initiated Contact with Patient 07/16/14 1532     Chief Complaint  Patient presents with  . Altered Mental Status     (Consider location/radiation/quality/duration/timing/severity/associated sxs/prior Treatment) Patient is a 80 y.o. female presenting with altered mental status.  Altered Mental Status Presenting symptoms: confusion   Associated symptoms: hallucinations   Associated symptoms: no agitation, no fever, no headaches, no light-headedness, no palpitations, no seizures and no weakness     80 yo female with hx of HTN, HLD, CVA, Afib on couamdin here with AMS. Patient was brought from [facility] by [facility] per daughter's concern for her AMS. She was doing well yesterday. Around afternoon time she went to the bathroom and getting out she slipped on something while talking to her CNA. She fell on her back and hit her head with the toilet seat. Didn't have LOC, seizure, chest pain, dizziness, or any other symptoms preceding or after the fall. She was doing well whole day yesterday after the fall. She was eating and drinking fine yesterday and today. Since this morning, her caregiver and CNA noticed that she is not behaving normally. She is disoriented, talking about her husband who has passed away long ago as if he is still here. daugther who lives in [place] called to check on her and she was not felt that the patient was not acting normally.   Her caregiver Gatha Mayer is present at the room. I also spoke to her daughter over the phone to get history. According to the caregiver, patient is still disoriented but improving somewhat compared to this morning.   Patient denies any hallucinations. Denies any pain. Denies fever/chills/n/v/chest pain/ sob, or cough. Feeling fine without any complaints.   Has hx of visual seizures after the CVA, has been under control with Keppra.  Has pacemaker for SSS. Monitored  by cardiology office.   Past Medical History  Diagnosis Date  . Glaucoma   . Hyperlipidemia   . Hypertension   . Stroke   . Paroxysmal atrial fibrillation     on coumadin  . Gout   . SSS (sick sinus syndrome) 2007    PPM 2007 by Dr. Verlon Setting  . Pulmonary embolus     on coumadin  . Partial seizures   . Cerebrovascular disease   . Myocardial infarction   . Degenerative arthritis   . GERD (gastroesophageal reflux disease)   . Helicobacter pylori (H. pylori) 2002  . Breast cancer     left mastectomy   Past Surgical History  Procedure Laterality Date  . Mastectomy Left   . Tonsillectomy    . Pacemaker insertion  2007    dual-chamber permanent pacemaker implantation by [name] MDT   Family History  Problem Relation Age of Onset  . Heart disease Mother   . Kidney disease Mother   . Heart disease Father   . Colon cancer Neg Hx    History  Substance Use Topics  . Smoking status: Never Smoker   . Smokeless tobacco: Never Used  . Alcohol Use: No   OB History   Grav Para Term Preterm Abortions TAB SAB Ect Mult Living                 Review of Systems  Constitutional: Negative for fever, chills and appetite change.  HENT: Negative for ear discharge, ear pain, sinus pressure, sneezing and tinnitus.   Eyes: Negative.  Respiratory: Negative for cough, chest tightness, shortness of breath and wheezing.   Cardiovascular: Negative for chest pain, palpitations and leg swelling.  Gastrointestinal: Negative.   Endocrine: Negative.   Genitourinary: Negative.   Musculoskeletal: Negative.   Skin: Negative.   Allergic/Immunologic: Negative.   Neurological: Negative for dizziness, tremors, seizures, syncope, facial asymmetry, speech difficulty, weakness, light-headedness, numbness and headaches.  Psychiatric/Behavioral: Positive for hallucinations and confusion. Negative for suicidal ideas, self-injury and agitation.      Allergies  Diclofenac sodium; Iodine; Other;  Sulfonamide derivatives; and Codeine  Home Medications   Prior to Admission medications   Medication Sig Start Date End Date Taking? Authorizing Provider  allopurinol (ZYLOPRIM) 300 MG tablet Take 300 mg by mouth daily.    Yes Historical Provider, MD  benazepril (LOTENSIN) 5 MG tablet Take 5 mg by mouth daily.   Yes Historical Provider, MD  bimatoprost (LUMIGAN) 0.03 % ophthalmic solution Place 1 drop into both eyes at bedtime.    Yes Historical Provider, MD  Calcium Carbonate-Vit D-Min (CALTRATE 600+D PLUS MINERALS PO) Take 1 tablet by mouth daily.   Yes Historical Provider, MD  hydrochlorothiazide (MICROZIDE) 12.5 MG capsule Take 12.5 mg by mouth every Wednesday and Saturday.    Yes Historical Provider, MD  levETIRAcetam (KEPPRA) 250 MG tablet Take 1 tablet (250 mg total) by mouth every 12 (twelve) hours. 12/24/13  Yes Philmore Pali, NP  Multiple Vitamin (MULTIVITAMIN) tablet Take 1 tablet by mouth daily.    Yes Historical Provider, MD  omeprazole (PRILOSEC) 20 MG capsule Take 20 mg by mouth daily.    Yes Historical Provider, MD  polyethylene glycol (MIRALAX / GLYCOLAX) packet Take 17 g by mouth daily as needed (for regularity).   Yes Historical Provider, MD  pravastatin (PRAVACHOL) 40 MG tablet Take 40 mg by mouth daily.    Yes Historical Provider, MD  Timolol Maleate 0.5 % (DAILY) SOLN Place 1 drop into both eyes 2 (two) times daily.    Yes Historical Provider, MD  trospium (SANCTURA) 20 MG tablet Take 20 mg by mouth 2 (two) times daily.   Yes Historical Provider, MD  warfarin (COUMADIN) 2 MG tablet Take 2 mg by mouth 4 (four) times a week. *Sunday, Tuesday, Wednesday, and Friday   Yes Historical Provider, MD  warfarin (COUMADIN) 3 MG tablet Take 3 mg by mouth 3 (three) times a week. *on Monday, Thursday, and Saturday   Yes Historical Provider, MD   BP 132/63  Pulse 90  Temp(Src) 97 F (36.1 C) (Oral)  Resp 16  SpO2 98% Physical Exam  Constitutional: She is oriented to person, place, and  time. She appears well-developed and well-nourished. No distress.  HENT:  Head: Normocephalic and atraumatic.  Right Ear: External ear normal.  Left Ear: External ear normal.  Nose: Nose normal.  Mouth/Throat: Uvula is midline and oropharynx is clear and moist. Mucous membranes are dry.  Has hearing difficulty on the left ear (not new).  Eyes: Conjunctivae and EOM are normal. Pupils are equal, round, and reactive to light. Right eye exhibits no discharge. Left eye exhibits no discharge. No scleral icterus.  Neck: Normal range of motion. Neck supple. No JVD present. No thyromegaly present.  Cardiovascular: Normal rate, regular rhythm, S1 normal, S2 normal, normal heart sounds and intact distal pulses.  Exam reveals no gallop and no friction rub.   No murmur heard. Pulmonary/Chest: Effort normal and breath sounds normal. No respiratory distress. She has no wheezes. She has no rales. She exhibits no  tenderness.  Abdominal: Soft. Bowel sounds are normal. She exhibits no distension and no mass. There is no tenderness. There is no rebound, no guarding and no CVA tenderness.  Musculoskeletal: Normal range of motion. She exhibits no edema and no tenderness.  Lymphadenopathy:    She has no cervical adenopathy.  Neurological: She is alert and oriented to person, place, and time. She has normal strength and normal reflexes. She is not disoriented. No cranial nerve deficit or sensory deficit. GCS eye subscore is 4. GCS verbal subscore is 5. GCS motor subscore is 6.  Normal finger-to-nose exam, normal heel-to-shin, no pronator drift, normal alternating movement.  Normal speech.  Skin: No rash noted. She is not diaphoretic. No erythema. No pallor.  Psychiatric: She has a normal mood and affect. Her speech is normal. Her mood appears not anxious. Her affect is not angry and not inappropriate.  Still asking about her husband to the caregiver.     ED Course  Procedures (including critical care time) Labs  Review Labs Reviewed  CBC WITH DIFFERENTIAL - Abnormal; Notable for the following:    RBC 3.49 (*)    Hemoglobin 11.1 (*)    HCT 33.8 (*)    Platelets 132 (*)    Monocytes Relative 15 (*)    Monocytes Absolute 1.1 (*)    All other components within normal limits  COMPREHENSIVE METABOLIC PANEL - Abnormal; Notable for the following:    Glucose, Bld 124 (*)    Albumin 3.1 (*)    AST 75 (*)    Alkaline Phosphatase 122 (*)    GFR calc non Af Amer 51 (*)    GFR calc Af Amer 59 (*)    All other components within normal limits  PROTIME-INR - Abnormal; Notable for the following:    Prothrombin Time 20.0 (*)    INR 1.70 (*)    All other components within normal limits  URINALYSIS, ROUTINE W REFLEX MICROSCOPIC    Imaging Review Ct Head Wo Contrast  07/16/2014   CLINICAL DATA:  Altered mental status.  EXAM: CT HEAD WITHOUT CONTRAST  TECHNIQUE: Contiguous axial images were obtained from the base of the skull through the vertex without intravenous contrast.  COMPARISON:  11/01/2013.  FINDINGS: No mass. No hydrocephalus. No hemorrhage. Old lacune inferior aspect of the left lenticular nucleus. Diffuse cerebral atrophy is present. No acute bony abnormality.  IMPRESSION: Diffuse cerebral atrophy. Old lacunar infarct inferior aspect left lenticular nucleus. No acute abnormality.   Electronically Signed   By: Marcello Moores  Register   On: 07/16/2014 17:00   Dg Chest Portable 2 Views  07/16/2014   CLINICAL DATA:  Altered mental status.  Short of breath.  EXAM: PORTABLE CHEST - 2 VIEW  COMPARISON:  08/15/2009  FINDINGS: Portable semi erect radiograph. Pacer with leads at right atrium and right ventricle. No lead discontinuity. Patient rotated moderately to the right.  Normal heart size. Aortic atherosclerosis. Moderate right hemidiaphragm elevation. No left and no definite right pleural fluid. No pneumothorax. Surgical clips over the left axilla. Favor scarring or atelectasis at the left lung base. Patchy right  upper lobe airspace disease suspected.  IMPRESSION: Right upper lobe airspace disease, suspicious for infection or aspiration. Consider PA and lateral radiographs if possible.  Favor left base scarring or atelectasis.  Moderate position degradation.   Electronically Signed   By: Abigail Miyamoto M.D.   On: 07/16/2014 17:11     EKG Interpretation None      Will do delirium workup.  CBC,CMP,UA,CXR, head ct. Patient is on coumadin.  CT head negative. CXR shows RUL PNA and left basilar scarring/atelectasis.   MDM   Final diagnoses:  Community acquired pneumonia  Delirium   Patient meets criteria of HCAP based on her living at assisted living with sometimes being transferred to their sister facility SNF few times. Will start HCAP treatment with vanc + zosyn (to cover anaerobics since RUL PNA could due to aspiration). Blood cultures before abx. Dr. Donney Rankins from [facility] will admit her.  Dellia Nims, MD 07/16/14 1758

## 2014-07-16 NOTE — Progress Notes (Signed)
ANTICOAGULATION CONSULT NOTE - Initial Consult  Pharmacy Consult for Coumadin Indication: atrial fibrillation  Allergies  Allergen Reactions  . Diclofenac Sodium     The gel  . Iodine     topical  . Other Nausea And Vomiting    Walnuts  . Sulfonamide Derivatives     unknown  . Codeine Nausea And Vomiting and Rash    Patient Measurements: Height: 5' 2.6" (159 cm) Weight: 134 lb 6.4 oz (60.963 kg) IBW/kg (Calculated) : 51.48 Heparin Dosing Weight:   Vital Signs: Temp: 98.3 F (36.8 C) (07/28 2027) Temp src: Oral (07/28 2027) BP: 141/72 mmHg (07/28 2027) Pulse Rate: 84 (07/28 2027)  Labs:  Recent Labs  07/16/14 1538 07/16/14 1548  HGB 11.1*  --   HCT 33.8*  --   PLT 132*  --   LABPROT  --  20.0*  INR  --  1.70*  CREATININE 0.94  --     Estimated Creatinine Clearance: 30.4 ml/min (by C-G formula based on Cr of 0.94).   Medical History: Past Medical History  Diagnosis Date  . Glaucoma   . Hyperlipidemia   . Hypertension   . Stroke   . Paroxysmal atrial fibrillation     on coumadin  . Gout   . SSS (sick sinus syndrome) 2007    PPM 2007 by Dr. Verlon Setting  . Pulmonary embolus     on coumadin  . Partial seizures   . Cerebrovascular disease   . Myocardial infarction   . Degenerative arthritis   . GERD (gastroesophageal reflux disease)   . Helicobacter pylori (H. pylori) 2002  . Breast cancer     left mastectomy     Medications:  Prescriptions prior to admission  Medication Sig Dispense Refill  . allopurinol (ZYLOPRIM) 300 MG tablet Take 300 mg by mouth daily.       . benazepril (LOTENSIN) 5 MG tablet Take 5 mg by mouth daily.      . bimatoprost (LUMIGAN) 0.03 % ophthalmic solution Place 1 drop into both eyes at bedtime.       . Calcium Carbonate-Vit D-Min (CALTRATE 600+D PLUS MINERALS PO) Take 1 tablet by mouth daily.      . hydrochlorothiazide (MICROZIDE) 12.5 MG capsule Take 12.5 mg by mouth every Wednesday and Saturday.       . levETIRAcetam  (KEPPRA) 250 MG tablet Take 1 tablet (250 mg total) by mouth every 12 (twelve) hours.  180 tablet  3  . Multiple Vitamin (MULTIVITAMIN) tablet Take 1 tablet by mouth daily.       Marland Kitchen omeprazole (PRILOSEC) 20 MG capsule Take 20 mg by mouth daily.       . polyethylene glycol (MIRALAX / GLYCOLAX) packet Take 17 g by mouth daily as needed (for regularity).      . pravastatin (PRAVACHOL) 40 MG tablet Take 40 mg by mouth daily.       . timolol (TIMOPTIC) 0.5 % ophthalmic solution Place 1 drop into both eyes 2 (two) times daily.      . trospium (SANCTURA) 20 MG tablet Take 20 mg by mouth 2 (two) times daily.      Marland Kitchen warfarin (COUMADIN) 2 MG tablet Take 2 mg by mouth 4 (four) times a week. *Sunday, Tuesday, Wednesday, and Friday      . warfarin (COUMADIN) 3 MG tablet Take 3 mg by mouth 3 (three) times a week. *on Monday, Thursday, and Saturday        Assessment: 80 y.o female admitted  with AMS s/p fall yesterday. Admission labs reveal a subtherapeutic INR of 1.7 on chronic coumadin for atrial fibrillation.  Reported that she has had confusion over the past couple of days as well as slipping in her bathroom. She hit her head on the toliet seat when she fell backward. No LOC. Today's CT head findings: no hemorrhage, no acute abnormality.  Her PTA coumadin dose was: coumadin 3 mg every Monday, Thursday,  Saturday and 2 mg every Sunday, Tues, Wed and Friday. Last taken today 7/28 (2mg  dose).     Goal of Therapy:  INR 2-3 Monitor platelets by anticoagulation protocol: Yes   Plan:  Give coumadin 2 mg po tonight in addition to the 2mg  dose she already took prior to admission.  Daily PT/INR   Nicole Cella, RPh Clinical Pharmacist Pager: [phone] 07/16/2014,8:56 PM

## 2014-07-16 NOTE — H&P (Signed)
PCP:   Sheela Stack, MD   Chief Complaint:  AMS, falls at ALF  HPI: Danielle Roth is a 80 y/o female with hx of HTN, HLD, CVA, Afib on couamdin here with AMS. Patient was brought from [facility].  Has had confusion over the pas couple of days as well as slipping in her bathroom.  It her head on the toliet seat whe she fell backward.  No LOC.  Daughter indicated to ER MD that she was not acting normally, but is not present at time of admission.  Caregiver is present and corroborates this but notes that [name] has not complained of anything specific in the past couple of days.  She does have a seizure disorder, but her falling was not accompanied with seizure like activity.   Review of Systems:  Review of Systems - Per HPI, but no obtainable from patient Past Medical History: Past Medical History  Diagnosis Date  . Glaucoma   . Hyperlipidemia   . Hypertension   . Stroke   . Paroxysmal atrial fibrillation     on coumadin  . Gout   . SSS (sick sinus syndrome) 2007    PPM 2007 by Dr. Verlon Setting  . Pulmonary embolus     on coumadin  . Partial seizures   . Cerebrovascular disease   . Myocardial infarction   . Degenerative arthritis   . GERD (gastroesophageal reflux disease)   . Helicobacter pylori (H. pylori) 2002  . Breast cancer     left mastectomy   Past Surgical History  Procedure Laterality Date  . Mastectomy Left   . Tonsillectomy    . Pacemaker insertion  2007    dual-chamber permanent pacemaker implantation by [name] MDT    Medications: Prior to Admission medications   Medication Sig Start Date End Date Taking? Authorizing Provider  allopurinol (ZYLOPRIM) 300 MG tablet Take 300 mg by mouth daily.    Yes Historical Provider, MD  benazepril (LOTENSIN) 5 MG tablet Take 5 mg by mouth daily.   Yes Historical Provider, MD  bimatoprost (LUMIGAN) 0.03 % ophthalmic solution Place 1 drop into both eyes at bedtime.    Yes Historical Provider, MD   Calcium Carbonate-Vit D-Min (CALTRATE 600+D PLUS MINERALS PO) Take 1 tablet by mouth daily.   Yes Historical Provider, MD  hydrochlorothiazide (MICROZIDE) 12.5 MG capsule Take 12.5 mg by mouth every Wednesday and Saturday.    Yes Historical Provider, MD  levETIRAcetam (KEPPRA) 250 MG tablet Take 1 tablet (250 mg total) by mouth every 12 (twelve) hours. 12/24/13  Yes Philmore Pali, NP  Multiple Vitamin (MULTIVITAMIN) tablet Take 1 tablet by mouth daily.    Yes Historical Provider, MD  omeprazole (PRILOSEC) 20 MG capsule Take 20 mg by mouth daily.    Yes Historical Provider, MD  polyethylene glycol (MIRALAX / GLYCOLAX) packet Take 17 g by mouth daily as needed (for regularity).   Yes Historical Provider, MD  pravastatin (PRAVACHOL) 40 MG tablet Take 40 mg by mouth daily.    Yes Historical Provider, MD  Timolol Maleate 0.5 % (DAILY) SOLN Place 1 drop into both eyes 2 (two) times daily.    Yes Historical Provider, MD  trospium (SANCTURA) 20 MG tablet Take 20 mg by mouth 2 (two) times daily.   Yes Historical Provider, MD  warfarin (COUMADIN) 2 MG tablet Take 2 mg by mouth 4 (four) times a week. *Sunday, Tuesday, Wednesday, and Friday   Yes Historical Provider, MD  warfarin (COUMADIN) 3  MG tablet Take 3 mg by mouth 3 (three) times a week. *on Monday, Thursday, and Saturday   Yes Historical Provider, MD    Allergies:   Allergies  Allergen Reactions  . Diclofenac Sodium     The gel  . Iodine     topical  . Other Nausea And Vomiting    Walnuts  . Sulfonamide Derivatives     unknown  . Codeine Nausea And Vomiting and Rash    Social History:  reports that she has never smoked. She has never used smokeless tobacco. She reports that she does not drink alcohol or use illicit drugs.  Family History: Family History  Problem Relation Age of Onset  . Heart disease Mother   . Kidney disease Mother   . Heart disease Father   . Colon cancer Neg Hx     Physical Exam: Filed Vitals:   07/16/14 1745  07/16/14 1800 07/16/14 1801 07/16/14 1802  BP: 142/55 144/59    Pulse: 94  95   Temp:    98.4 F (36.9 C)  TempSrc:      Resp: 21  16   Height:      Weight:      SpO2: 92%  99%    General appearance: alert, cooperative, appears stated age and distracted Head: Normocephalic, without obvious abnormality, atraumatic Eyes: conjunctivae/corneas clear. PERRL, EOM's intact.  Nose: Nares normal. Septum midline. Mucosa normal. No drainage or sinus tenderness. Throat: lips, mucosa, and tongue normal; teeth and gums normal Neck: no adenopathy, no carotid bruit, no JVD and thyroid not enlarged, symmetric, no tenderness/mass/nodules Resp: bronchophony RUL and diminished breath sounds RUL  L breast absent s/p mastectomy Cardio: irregularly irregular rhythm GI: soft, non-tender; bowel sounds normal; no masses,  no organomegaly Extremities: extremities normal, atraumatic, no cyanosis or edema Pulses: 2+ and symmetric Lymph nodes: Cervical adenopathy: no cervical lymphadenopathy Neurologic: Alert and oriented X 3, normal strength and tone. Normal symmetric reflexes.     Labs on Admission:   Recent Labs  07/16/14 1538  NA 139  K 4.0  CL 105  CO2 22  GLUCOSE 124*  BUN 15  CREATININE 0.94  CALCIUM 9.4    Recent Labs  07/16/14 1538  AST 75*  ALT 10  ALKPHOS 122*  BILITOT 1.0  PROT 6.1  ALBUMIN 3.1*    Recent Labs  07/16/14 1538  WBC 7.2  NEUTROABS 4.9  HGB 11.1*  HCT 33.8*  MCV 96.8  PLT 132*    Lab Results  Component Value Date   INR 1.70* 07/16/2014   INR 2.14* 11/01/2013   INR 1.45 10/22/2013    Radiological Exams on Admission: Ct Head Wo Contrast  07/16/2014   CLINICAL DATA:  Altered mental status.  EXAM: CT HEAD WITHOUT CONTRAST  TECHNIQUE: Contiguous axial images were obtained from the base of the skull through the vertex without intravenous contrast.  COMPARISON:  11/01/2013.  FINDINGS: No mass. No hydrocephalus. No hemorrhage. Old lacune inferior aspect of  the left lenticular nucleus. Diffuse cerebral atrophy is present. No acute bony abnormality.  IMPRESSION: Diffuse cerebral atrophy. Old lacunar infarct inferior aspect left lenticular nucleus. No acute abnormality.   Electronically Signed   By: Marcello Moores  Register   On: 07/16/2014 17:00   Dg Chest Portable 2 Views  07/16/2014   CLINICAL DATA:  Altered mental status.  Short of breath.  EXAM: PORTABLE CHEST - 2 VIEW  COMPARISON:  08/15/2009  FINDINGS: Portable semi erect radiograph. Pacer with leads at right  atrium and right ventricle. No lead discontinuity. Patient rotated moderately to the right.  Normal heart size. Aortic atherosclerosis. Moderate right hemidiaphragm elevation. No left and no definite right pleural fluid. No pneumothorax. Surgical clips over the left axilla. Favor scarring or atelectasis at the left lung base. Patchy right upper lobe airspace disease suspected.  IMPRESSION: Right upper lobe airspace disease, suspicious for infection or aspiration. Consider PA and lateral radiographs if possible.  Favor left base scarring or atelectasis.  Moderate position degradation.   Electronically Signed   By: Abigail Miyamoto M.D.   On: 07/16/2014 17:11   Orders placed during the hospital encounter of 11/01/13  . EKG 12-LEAD  . EKG 12-LEAD    Assessment/Plan Active Problems:   Pneumonia Has received Zosyn and Vancomycin in the ER due to her ALF/time in SNF portion of [facility].  WIll repeat CXR tomorrow with more hydration as well, Oxygen, albuterol as needed but doesn't have a considerable work of breathing.  Also no elevation in WBC.  May need to consider CT if concern for malignancy AMS  Possibly related to above.  CT head negative.  Hydrate and observe and recheck in AM. Falls:  No trauma noted, PT eval Afib: INR 1.7, Coumadin per pharmacy as underanticoagulated at this time Protein Calorie Malnutrition:  Likely partially due to AMS.  Nutrition eval when more coherent. Glaucoma: COntinue eye  drops GERD  PPI Hyperlipid: Zocor per formulary. CAD s/p MI:  No evidence of acute cardiac issues at this point. BRCA s/p L mastectomy:  As noted, CA history, may need to consider CT chest if not clearing with abx. Hyperglycemia:  Check A1c PE history:  Could also relate to her opacity, on Coumadin, but slightly below therapeutic.  I don;t have record of prior INR on hand.  She notes no chest pain or dyspnea, so not clinically c/w recurrence and would not cause AMS in this case.   Finnick Orosz W 07/16/2014, 6:03 PM

## 2014-07-17 ENCOUNTER — Inpatient Hospital Stay (HOSPITAL_COMMUNITY): Payer: Medicare Other

## 2014-07-17 LAB — COMPREHENSIVE METABOLIC PANEL
ALK PHOS: 108 U/L (ref 39–117)
ALT: 9 U/L (ref 0–35)
AST: 57 U/L — ABNORMAL HIGH (ref 0–37)
Albumin: 2.6 g/dL — ABNORMAL LOW (ref 3.5–5.2)
Anion gap: 15 (ref 5–15)
BUN: 13 mg/dL (ref 6–23)
CALCIUM: 9.3 mg/dL (ref 8.4–10.5)
CHLORIDE: 104 meq/L (ref 96–112)
CO2: 21 mEq/L (ref 19–32)
Creatinine, Ser: 0.92 mg/dL (ref 0.50–1.10)
GFR, EST AFRICAN AMERICAN: 60 mL/min — AB (ref >90)
GFR, EST NON AFRICAN AMERICAN: 52 mL/min — AB (ref >90)
GLUCOSE: 101 mg/dL — AB (ref 70–99)
Potassium: 4.1 mEq/L (ref 3.7–5.3)
Sodium: 140 mEq/L (ref 137–147)
Total Bilirubin: 1.2 mg/dL (ref 0.3–1.2)
Total Protein: 5.4 g/dL — ABNORMAL LOW (ref 6.0–8.3)

## 2014-07-17 LAB — BLOOD GAS, ARTERIAL
Acid-base deficit: 0.4 mmol/L (ref 0.0–2.0)
Bicarbonate: 23.5 mEq/L (ref 20.0–24.0)
DRAWN BY: 283381
FIO2: 0.21 %
O2 Saturation: 97.6 %
PCO2 ART: 36.5 mmHg (ref 35.0–45.0)
PO2 ART: 75.4 mmHg — AB (ref 80.0–100.0)
Patient temperature: 98.6
TCO2: 24.6 mmol/L (ref 0–100)
pH, Arterial: 7.424 (ref 7.350–7.450)

## 2014-07-17 LAB — CBC
HCT: 30.6 % — ABNORMAL LOW (ref 36.0–46.0)
HEMOGLOBIN: 10 g/dL — AB (ref 12.0–15.0)
MCH: 31.8 pg (ref 26.0–34.0)
MCHC: 32.7 g/dL (ref 30.0–36.0)
MCV: 97.5 fL (ref 78.0–100.0)
Platelets: 106 10*3/uL — ABNORMAL LOW (ref 150–400)
RBC: 3.14 MIL/uL — ABNORMAL LOW (ref 3.87–5.11)
RDW: 14.8 % (ref 11.5–15.5)
WBC: 5.3 10*3/uL (ref 4.0–10.5)

## 2014-07-17 LAB — PROTIME-INR
INR: 1.88 — ABNORMAL HIGH (ref 0.00–1.49)
Prothrombin Time: 21.6 seconds — ABNORMAL HIGH (ref 11.6–15.2)

## 2014-07-17 LAB — MRSA PCR SCREENING: MRSA BY PCR: NEGATIVE

## 2014-07-17 LAB — PREALBUMIN: Prealbumin: 10.9 mg/dL — ABNORMAL LOW (ref 17.0–34.0)

## 2014-07-17 LAB — HEMOGLOBIN A1C
Hgb A1c MFr Bld: 5.7 % — ABNORMAL HIGH (ref <5.7)
MEAN PLASMA GLUCOSE: 117 mg/dL — AB (ref <117)

## 2014-07-17 MED ORDER — CIPROFLOXACIN IN D5W 400 MG/200ML IV SOLN
400.0000 mg | Freq: Two times a day (BID) | INTRAVENOUS | Status: DC
  Administered 2014-07-17 – 2014-07-19 (×5): 400 mg via INTRAVENOUS
  Filled 2014-07-17 (×6): qty 200

## 2014-07-17 MED ORDER — ENSURE COMPLETE PO LIQD
237.0000 mL | Freq: Two times a day (BID) | ORAL | Status: DC
  Administered 2014-07-17 – 2014-07-20 (×6): 237 mL via ORAL

## 2014-07-17 NOTE — Clinical Social Work Psychosocial (Signed)
Clinical Social Work Department BRIEF PSYCHOSOCIAL ASSESSMENT 07/17/2014  Patient:  Danielle Roth, Danielle Roth     Account Number:  [id number]     Admit date:  07/16/2014  Clinical Social Worker:  Lovey Newcomer  Date/Time:  07/17/2014 10:49 AM  Referred by:  Physician  Date Referred:  07/17/2014 Referred for  SNF Placement   Other Referral:   Interview type:  Family Other interview type:   Patient's daughter interviewed at bedside to complete assessment.    PSYCHOSOCIAL DATA Living Status:  FACILITY Admitted from facility:  [facility] Level of care:  [place] Primary support name:  Danielle Roth Primary support relationship to patient:  CHILD, ADULT Degree of support available:   Support is strong.    CURRENT CONCERNS Current Concerns  Post-Acute Placement   Other Concerns:    SOCIAL WORK ASSESSMENT / PLAN CSW met with patient's daughter Danielle Roth and patient at bedside to complete assessment. Patient was admitted from [facility] where she receives supervision and care by in home aides 8 hrs. a day during the week and 4 hours a day on the weekeneds. CSW explained that patient may need a higher level of care a discharge and daughter is agreeable to this if it is needed. Daughter plans for patient to go to [facility] SNF if needed. Daughter states that she lives in [place] and the other daughter lives in [place]. Daughter was pleasant and welcoming of CSW.  CSW will assist with DC if necessary.   Assessment/plan status:  Psychosocial Support/Ongoing Assessment of Needs Other assessment/ plan:   Complete Fl2, Fax, PASRR   Information/referral to community resources:   CSW contact information given.    PATIENT'S/FAMILY'S RESPONSE TO PLAN OF CARE: Patient's family states that they want patient to return to [facility] at discharge. CSW will assist as necessary.       Liz Beach MSW, [place], [place], [phone]

## 2014-07-17 NOTE — Progress Notes (Addendum)
Subjective: No fever overnight. Staying very sleepy. Won't open eyes.  Objective: Vital signs in last 24 hours: Temp:  [97 F (36.1 C)-98.4 F (36.9 C)] 98.4 F (36.9 C) (07/29 0439) Pulse Rate:  [77-95] 77 (07/29 0439) Resp:  [16-23] 20 (07/29 0439) BP: (121-144)/(49-72) 121/49 mmHg (07/29 0439) SpO2:  [92 %-100 %] 96 % (07/29 0439) Weight:  [60.963 kg (134 lb 6.4 oz)-67.6 kg (149 lb 0.5 oz)] 60.963 kg (134 lb 6.4 oz) (07/28 2027)  Intake/Output from previous day: 07/28 0701 - 07/29 0700 In: 251.3 [I.V.:251.3] Out: -  Intake/Output this shift:    Lying at 30 degrees. Keeps eyes closed. Lungs a few rhonchi, ht IR IR, abd soft NT, neuro: will slowly speak keeps eyes closed  Lab Results   Recent Labs  07/16/14 1538 07/17/14 0553  WBC 7.2 5.3  RBC 3.49* 3.14*  HGB 11.1* 10.0*  HCT 33.8* 30.6*  MCV 96.8 97.5  MCH 31.8 31.8  RDW 14.8 14.8  PLT 132* 106*    Recent Labs  07/16/14 1538 07/17/14 0553  NA 139 140  K 4.0 4.1  CL 105 104  CO2 22 21  GLUCOSE 124* 101*  BUN 15 13  CREATININE 0.94 0.92  CALCIUM 9.4 9.3    Studies/Results: Ct Head Wo Contrast  07/16/2014   CLINICAL DATA:  Altered mental status.  EXAM: CT HEAD WITHOUT CONTRAST  TECHNIQUE: Contiguous axial images were obtained from the base of the skull through the vertex without intravenous contrast.  COMPARISON:  11/01/2013.  FINDINGS: No mass. No hydrocephalus. No hemorrhage. Old lacune inferior aspect of the left lenticular nucleus. Diffuse cerebral atrophy is present. No acute bony abnormality.  IMPRESSION: Diffuse cerebral atrophy. Old lacunar infarct inferior aspect left lenticular nucleus. No acute abnormality.   Electronically Signed   By: Marcello Moores  Register   On: 07/16/2014 17:00   Dg Chest Portable 2 Views  07/16/2014   CLINICAL DATA:  Altered mental status.  Short of breath.  EXAM: PORTABLE CHEST - 2 VIEW  COMPARISON:  08/15/2009  FINDINGS: Portable semi erect radiograph. Pacer with leads at right  atrium and right ventricle. No lead discontinuity. Patient rotated moderately to the right.  Normal heart size. Aortic atherosclerosis. Moderate right hemidiaphragm elevation. No left and no definite right pleural fluid. No pneumothorax. Surgical clips over the left axilla. Favor scarring or atelectasis at the left lung base. Patchy right upper lobe airspace disease suspected.  IMPRESSION: Right upper lobe airspace disease, suspicious for infection or aspiration. Consider PA and lateral radiographs if possible.  Favor left base scarring or atelectasis.  Moderate position degradation.   Electronically Signed   By: Abigail Miyamoto M.D.   On: 07/16/2014 17:11    Scheduled Meds: . allopurinol  300 mg Oral Daily  . apixaban  2.5 mg Oral BID  . benazepril  5 mg Oral Daily  . darifenacin  7.5 mg Oral Daily  . docusate sodium  100 mg Oral BID  . hydrochlorothiazide  12.5 mg Oral Q Wed,Sat  . latanoprost  1 drop Both Eyes QHS  . levETIRAcetam  250 mg Oral Q12H  . pantoprazole  40 mg Oral Daily  . piperacillin-tazobactam (ZOSYN)  IV  3.375 g Intravenous 3 times per day  . simvastatin  20 mg Oral q1800  . sodium chloride  3 mL Intravenous Q12H  . timolol  1 drop Both Eyes BID  . vancomycin  1,000 mg Intravenous Q24H   Continuous Infusions: . 0.9 % NaCl with KCl  20 mEq / L 75 mL/hr (07/16/14 2146)   PRN Meds:acetaminophen, acetaminophen, albuterol, alum & mag hydroxide-simeth, ondansetron (ZOFRAN) IV, ondansetron, polyethylene glycol, zolpidem  Assessment/Plan:  Pneumonia : repeat XR doesn't look any worse, Sats OK. Check ABG due to somnolence  UTI: will add Cipro and stop vanco AMS Possibly related to above. CT head negative. Hydrate and observe and recheck in AM.  Falls: No trauma noted, PT eval  Afib: INR 1.7, Coumadin per pharmacy as underanticoagulated at this time  Protein Calorie Malnutrition: Likely partially due to AMS. Nutrition eval when more coherent.  Glaucoma: Continue eye drops  GERD  PPI  Hyperlipid: Zocor per formulary.  CAD s/p MI: No evidence of acute cardiac issues at this point.  BRCA s/p L mastectomy: As noted, CA history, may need to consider CT chest if not clearing with abx.  Hyperglycemia: Check A1c pending PE history: Could also relate to her opacity, on Coumadin, but slightly below therapeutic. I don;t have record of prior INR on hand. She notes no chest pain or dyspnea, so not clinically c/w recurrence and would not cause AMS in this case.    LOS: 1 day   SOUTH,STEPHEN ALAN 07/17/2014, 8:14 AM

## 2014-07-17 NOTE — Progress Notes (Signed)
Utilization review completed.  

## 2014-07-17 NOTE — Progress Notes (Signed)
PT Cancellation Note  Patient Details Name: Danielle Roth MRN: [id number] DOB: 09/11/1918   Cancelled Treatment:    Reason Eval/Treat Not Completed: Patient's level of consciousness Pt not able to stay aroused to participate in therapy. Able to open eyes with constant stimulus however falls asleep, snoring during conversation. Per RN, pt has been like this all morning. Daughter present in room. Will follow up on 7/30 or when pt appropriate.   Candy Sledge A 07/17/2014, 12:42 PM Candy Sledge, [place], DPT [phone]

## 2014-07-17 NOTE — Progress Notes (Signed)
INITIAL NUTRITION ASSESSMENT  DOCUMENTATION CODES Per approved criteria  -Not Applicable   INTERVENTION: - Ensure Complete po BID, each supplement provides 350 kcal and 13 grams of protein - RD will continue to monitor for nutrition care plan.  NUTRITION DIAGNOSIS: Inadequate oral intake related to AMS and falls as evidenced by weight loss since January.   Goal: Pt to meet >/= 90% of their estimated nutrition needs   Monitor:  Weight trends, po intake, acceptance of supplements, labs  Reason for Assessment: MST and Consult for nutrition assessment  80 y.o. female  Admitting Dx: <principal problem not specified>  ASSESSMENT: 80 y/o female with hx of HTN, HLD, CVA, Afib on couamdin here with AMS. Patient was brought from [facility] assisted living. Has had confusion over the pas couple of days as well as slipping in her bathroom. It her head on the toliet seat whe she fell backward.   - Per pt's aid, pt eats a big breakfast each morning, but skips lunch often. Pt usually eats dinner. Aid was unsure of pt's normal body weight, but per chart history pt's weight has been trending downward over the past several years. Pt does not drink any nutritional supplements at home, but was advised to use them to supplement meals when she doesn't feel like eating.   Height: Ht Readings from Last 1 Encounters:  07/16/14 5\' 4"  (1.626 m)    Weight: Wt Readings from Last 1 Encounters:  07/16/14 134 lb 6.4 oz (60.963 kg)    Ideal Body Weight: 54.7 kg  % Ideal Body Weight: 111%  Wt Readings from Last 10 Encounters:  07/16/14 134 lb 6.4 oz (60.963 kg)  12/24/13 149 lb (67.586 kg)  10/02/13 149 lb (67.586 kg)  02/05/13 164 lb (74.39 kg)  02/03/12 177 lb 12.8 oz (80.65 kg)  06/18/11 186 lb (84.369 kg)  11/06/10 183 lb (83.008 kg)  10/28/09 175 lb 6.1 oz (79.552 kg)    Usual Body Weight: unsure  % Usual Body Weight: n/a  BMI:  Body mass index is 23.06 kg/(m^2).  Estimated Nutritional  Needs: Kcal: 1550-1700 Protein: 75-85 g Fluid: 1.7 L/day  Skin: WNL  Diet Order: Cardiac  EDUCATION NEEDS: -Education needs addressed   Intake/Output Summary (Last 24 hours) at 07/17/14 1441 Last data filed at 07/17/14 0107  Gross per 24 hour  Intake 251.25 ml  Output      0 ml  Net 251.25 ml    Last BM: none recorded   Labs:   Recent Labs Lab 07/16/14 1538 07/17/14 0553  NA 139 140  K 4.0 4.1  CL 105 104  CO2 22 21  BUN 15 13  CREATININE 0.94 0.92  CALCIUM 9.4 9.3  GLUCOSE 124* 101*    CBG (last 3)  No results found for this basename: GLUCAP,  in the last 72 hours  Scheduled Meds: . allopurinol  300 mg Oral Daily  . apixaban  2.5 mg Oral BID  . benazepril  5 mg Oral Daily  . ciprofloxacin  400 mg Intravenous Q12H  . darifenacin  7.5 mg Oral Daily  . docusate sodium  100 mg Oral BID  . hydrochlorothiazide  12.5 mg Oral Q Wed,Sat  . latanoprost  1 drop Both Eyes QHS  . levETIRAcetam  250 mg Oral Q12H  . pantoprazole  40 mg Oral Daily  . piperacillin-tazobactam (ZOSYN)  IV  3.375 g Intravenous 3 times per day  . simvastatin  20 mg Oral q1800  . sodium chloride  3 mL Intravenous Q12H  . timolol  1 drop Both Eyes BID    Continuous Infusions: . 0.9 % NaCl with KCl 20 mEq / L 75 mL/hr (07/16/14 2146)    Past Medical History  Diagnosis Date  . Glaucoma   . Hyperlipidemia   . Hypertension   . Stroke   . Paroxysmal atrial fibrillation     on coumadin  . Gout   . SSS (sick sinus syndrome) 2007    PPM 2007 by Dr. Verlon Setting  . Pulmonary embolus     on coumadin  . Partial seizures   . Cerebrovascular disease   . Myocardial infarction   . Degenerative arthritis   . GERD (gastroesophageal reflux disease)   . Helicobacter pylori (H. pylori) 2002  . Breast cancer     left mastectomy    Past Surgical History  Procedure Laterality Date  . Mastectomy Left   . Tonsillectomy    . Pacemaker insertion  2007    dual-chamber permanent pacemaker  implantation by [name] MDT    Terrace Arabia RD, LDN

## 2014-07-18 NOTE — Progress Notes (Signed)
Subjective: More interactive, no pain. Breathing OK. Ate a little yesterday  Objective: Vital signs in last 24 hours: Temp:  [97.6 F (36.4 C)-98.6 F (37 C)] 98.6 F (37 C) (07/30 0446) Pulse Rate:  [59] 59 (07/30 0446) Resp:  [18-20] 18 (07/30 0446) BP: (100-106)/(60-65) 106/65 mmHg (07/30 0446) SpO2:  [95 %] 95 % (07/30 0446)  Intake/Output from previous day: 07/29 0701 - 07/30 0700 In: 2478.8 [I.V.:2178.8; IV Piggyback:300] Out: -  Intake/Output this shift:    Lying flat without dyspnea. Face symmetric. Lungs fairly clear. Ht IR IR  abd soft NT, awake, more interactive, still not at her neuro baseline  Lab Results   Recent Labs  07/16/14 1538 07/17/14 0553  WBC 7.2 5.3  RBC 3.49* 3.14*  HGB 11.1* 10.0*  HCT 33.8* 30.6*  MCV 96.8 97.5  MCH 31.8 31.8  RDW 14.8 14.8  PLT 132* 106*    Recent Labs  07/16/14 1538 07/17/14 0553  NA 139 140  K 4.0 4.1  CL 105 104  CO2 22 21  GLUCOSE 124* 101*  BUN 15 13  CREATININE 0.94 0.92  CALCIUM 9.4 9.3    Studies/Results: Dg Chest 2 View  07/17/2014   CLINICAL DATA:  Weakness, shortness of breath, pneumonia, followup; past history hypertension, stroke, atrial fibrillation, pulmonary embolism, MI, breast cancer  EXAM: CHEST  2 VIEW  COMPARISON:  07/16/2014  FINDINGS: RIGHT subclavian transvenous pacemaker leads project at RIGHT atrium and RIGHT ventricle.  Post LEFT mastectomy and axillary node dissection.  Atherosclerotic calcification aorta.  Enlargement of cardiac silhouette.  Mediastinal contours and pulmonary vascularity normal.  Emphysematous changes with minimal streaky atelectasis in LEFT lower lobe.  No definite acute infiltrate, pleural effusion or pneumothorax.  Bones demineralized.  IMPRESSION: Atelectasis LEFT lower lobe.  Enlargement of cardiac silhouette.   Electronically Signed   By: Lavonia Dana M.D.   On: 07/17/2014 08:13   Ct Head Wo Contrast  07/16/2014   CLINICAL DATA:  Altered mental status.  EXAM: CT  HEAD WITHOUT CONTRAST  TECHNIQUE: Contiguous axial images were obtained from the base of the skull through the vertex without intravenous contrast.  COMPARISON:  11/01/2013.  FINDINGS: No mass. No hydrocephalus. No hemorrhage. Old lacune inferior aspect of the left lenticular nucleus. Diffuse cerebral atrophy is present. No acute bony abnormality.  IMPRESSION: Diffuse cerebral atrophy. Old lacunar infarct inferior aspect left lenticular nucleus. No acute abnormality.   Electronically Signed   By: Marcello Moores  Register   On: 07/16/2014 17:00   Dg Chest Portable 2 Views  07/16/2014   CLINICAL DATA:  Altered mental status.  Short of breath.  EXAM: PORTABLE CHEST - 2 VIEW  COMPARISON:  08/15/2009  FINDINGS: Portable semi erect radiograph. Pacer with leads at right atrium and right ventricle. No lead discontinuity. Patient rotated moderately to the right.  Normal heart size. Aortic atherosclerosis. Moderate right hemidiaphragm elevation. No left and no definite right pleural fluid. No pneumothorax. Surgical clips over the left axilla. Favor scarring or atelectasis at the left lung base. Patchy right upper lobe airspace disease suspected.  IMPRESSION: Right upper lobe airspace disease, suspicious for infection or aspiration. Consider PA and lateral radiographs if possible.  Favor left base scarring or atelectasis.  Moderate position degradation.   Electronically Signed   By: Abigail Miyamoto M.D.   On: 07/16/2014 17:11    Scheduled Meds: . allopurinol  300 mg Oral Daily  . apixaban  2.5 mg Oral BID  . benazepril  5 mg Oral  Daily  . ciprofloxacin  400 mg Intravenous Q12H  . darifenacin  7.5 mg Oral Daily  . docusate sodium  100 mg Oral BID  . feeding supplement (ENSURE COMPLETE)  237 mL Oral BID BM  . hydrochlorothiazide  12.5 mg Oral Q Wed,Sat  . latanoprost  1 drop Both Eyes QHS  . levETIRAcetam  250 mg Oral Q12H  . pantoprazole  40 mg Oral Daily  . piperacillin-tazobactam (ZOSYN)  IV  3.375 g Intravenous 3  times per day  . simvastatin  20 mg Oral q1800  . sodium chloride  3 mL Intravenous Q12H  . timolol  1 drop Both Eyes BID   Continuous Infusions: . 0.9 % NaCl with KCl 20 mEq / L 75 mL/hr at 07/17/14 1955   PRN Meds:acetaminophen, acetaminophen, albuterol, alum & mag hydroxide-simeth, ondansetron (ZOFRAN) IV, ondansetron, polyethylene glycol, zolpidem  Assessment/Plan:  Pneumonia : no hypercarbia, suspect fever was more UTI related, BC's neg UTI: it doesn't look like a cx was sent. , on zosyn and cipro AMS doing a bit better Afib: stable rate Protein Calorie Malnutrition: Likely partially due to AMS. Nutrition eval when more coherent.  Glaucoma: Continue eye drops  GERD PPI  Hyperlipid: Zocor per formulary.  CAD s/p MI: stable BRCA s/p L mastectomy:  NED Hyperglycemia: Check A1c  Was OK   Ref. Range 07/17/2014 05:53  Hemoglobin A1C Latest Range: <5.7 % 5.7 (H)  Glucose Latest Range: 70-99 mg/dL 101 (H)   PE history: on anticoag DNR DISPOSITION: likely to [facility] when more stable   LOS: 2 days   Patriece Archbold ALAN 07/18/2014, 8:12 AM

## 2014-07-18 NOTE — Evaluation (Signed)
Physical Therapy Evaluation Patient Details Name: Danielle Roth MRN: [id number] DOB: Aug 07, 1918 Today's Date: 07/18/2014   History of Present Illness  Patient is a 80 y/o female admitted with AMS, hallucinations amd confusion s/p fall on 7/27 diagnosed with PNA. PMH positive for HTN, HLD, CVA, A-fib on Coumadin, seizure disorder, PE, SSS and breast ca s/p left mastectomy ~15 years ago. Chest XRAY- Right upper lobe airspace disease, suspicious for infection or aspiration.   Clinical Impression  Patient presents with functional limitations due to deficits listed in PT problem list below. Pt HOH making communication difficulty as pt requires constant cues and repetition to perform task. Demonstrates balance deficits during ambulation requiring hands on assist for safety. Pt would benefit from skilled PT in ST SNF setting prior to discharge home to maximize independence, improve safe mobility and ease burden of care. Daughter aware of need for more care at this time, but ultimately plan is to transition home with aide support after short term rehab, if needed. If ST SNF not an option, pt would benefit from HHPT.    Follow Up Recommendations SNF;Supervision/Assistance - 24 hour    Equipment Recommendations  None recommended by PT    Recommendations for Other Services       Precautions / Restrictions Precautions Precautions: Fall Restrictions Weight Bearing Restrictions: No      Mobility  Bed Mobility Overal bed mobility: Needs Assistance Bed Mobility: Supine to Sit;Sit to Supine     Supine to sit: Min assist;HOB elevated Sit to supine: Min assist   General bed mobility comments: Pt HOH so requires constant repetition and tactile cues to perform transfer. Manual cues for hand placement and assist for technique. Physical assist to initiate transfer but then able to bring trunk the rest the way upright. Assist with lowering trunk to return to supine.  Transfers Overall transfer  level: Needs assistance Equipment used: 4-wheeled walker Transfers: Sit to/from [facility] Sit to Stand: Min assist Stand pivot transfers: Min assist       General transfer comment: SPT bed<-> BSC with Min A for balance; manual cues to place hands in proper position for transfer. Stood from [employer] x2 with and without rollator, cues for technique/safety.   Ambulation/Gait Ambulation/Gait assistance: Min assist Ambulation Distance (Feet): 90 Feet Assistive device: 4-wheeled walker;1 person hand held assist Gait Pattern/deviations: Step-through pattern;Decreased stride length;Trunk flexed;Shuffle   Gait velocity interpretation: Below normal speed for age/gender General Gait Details: Shuffling like gait pattern with VC for upright posture and directional cues. Drifting with rollator at times. Required VC to unlock brakes prior to ambulation. Min A for safety/rollator management. VC for encouragement to participate.  Stairs            Wheelchair Mobility    Modified Rankin (Stroke Patients Only)       Balance Overall balance assessment: Needs assistance   Sitting balance-Leahy Scale: Fair Sitting balance - Comments: Able to sit EOB without back support and participate in dynamic activities without LOB.     Standing balance-Leahy Scale: Poor Standing balance comment: Use of rollator/therapist for support upon standing. Total A for pericare. Unsteady with static and dynamic standing activites especially mobility.                             Pertinent Vitals/Pain No pain reported. No SOB noted during session. Pt repositioned in supine per request at end of session declining to sit in chair. Daughter in  room.    Home Living Family/patient expects to be discharged to:: Assisted living               Home Equipment: Walker - 4 wheels;Bedside commode      Prior Function Level of Independence: Needs assistance   Gait / Transfers Assistance  Needed: Uses rolllator for ambulation short distances. Able to walk half way to dining hall and then uses transport chair the rest the way per daughter.  ADL's / Homemaking Assistance Needed: Has 2 aides throughout most the day with a 4 hour break where patient is alone (daughter reports pt usually sleeps at that time) to assist with ADLs and IADLs.        Hand Dominance        Extremity/Trunk Assessment   Upper Extremity Assessment: Overall WFL for tasks assessed (age appropriate. )           Lower Extremity Assessment: Generalized weakness         Communication   Communication: HOH  Cognition Arousal/Alertness: Awake/alert Behavior During Therapy: WFL for tasks assessed/performed Overall Cognitive Status: Within Functional Limits for tasks assessed                      General Comments      Exercises        Assessment/Plan    PT Assessment Patient needs continued PT services  PT Diagnosis Generalized weakness;Difficulty walking   PT Problem List Decreased strength;Decreased activity tolerance;Decreased knowledge of use of DME;Decreased balance;Decreased safety awareness;Decreased mobility;Decreased knowledge of precautions  PT Treatment Interventions DME instruction;Gait training;Functional mobility training;Patient/family education;Therapeutic exercise;Therapeutic activities;Balance training   PT Goals (Current goals can be found in the Care Plan section) Acute Rehab PT Goals PT Goal Formulation: Patient unable to participate in goal setting    Frequency Min 2X/week   Barriers to discharge        Co-evaluation               End of Session Equipment Utilized During Treatment: Gait belt Activity Tolerance: Patient tolerated treatment well Patient left: in bed;with call bell/phone within reach;with bed alarm set;with family/visitor present Nurse Communication: Mobility status         Time: 0940-1003 PT Time Calculation (min): 23  min   Charges:   PT Evaluation $Initial PT Evaluation Tier I: 1 Procedure PT Treatments $Therapeutic Activity: 8-22 mins   PT G CodesCandy Sledge A 07/18/2014, 10:17 AM Candy Sledge, PT, DPT [phone]

## 2014-07-19 ENCOUNTER — Inpatient Hospital Stay (HOSPITAL_COMMUNITY): Payer: Medicare Other

## 2014-07-19 LAB — COMPREHENSIVE METABOLIC PANEL
ALT: 8 U/L (ref 0–35)
AST: 29 U/L (ref 0–37)
Albumin: 2.4 g/dL — ABNORMAL LOW (ref 3.5–5.2)
Alkaline Phosphatase: 93 U/L (ref 39–117)
Anion gap: 11 (ref 5–15)
BUN: 10 mg/dL (ref 6–23)
CO2: 21 meq/L (ref 19–32)
Calcium: 9.4 mg/dL (ref 8.4–10.5)
Chloride: 107 mEq/L (ref 96–112)
Creatinine, Ser: 0.97 mg/dL (ref 0.50–1.10)
GFR calc Af Amer: 57 mL/min — ABNORMAL LOW (ref >90)
GFR, EST NON AFRICAN AMERICAN: 49 mL/min — AB (ref >90)
Glucose, Bld: 102 mg/dL — ABNORMAL HIGH (ref 70–99)
Potassium: 4.2 mEq/L (ref 3.7–5.3)
SODIUM: 139 meq/L (ref 137–147)
TOTAL PROTEIN: 5.4 g/dL — AB (ref 6.0–8.3)
Total Bilirubin: 0.9 mg/dL (ref 0.3–1.2)

## 2014-07-19 LAB — CBC
HCT: 24.8 % — ABNORMAL LOW (ref 36.0–46.0)
Hemoglobin: 7.7 g/dL — ABNORMAL LOW (ref 12.0–15.0)
MCH: 31.6 pg (ref 26.0–34.0)
MCHC: 31 g/dL (ref 30.0–36.0)
MCV: 101.6 fL — ABNORMAL HIGH (ref 78.0–100.0)
PLATELETS: 105 10*3/uL — AB (ref 150–400)
RBC: 2.44 MIL/uL — ABNORMAL LOW (ref 3.87–5.11)
RDW: 15.1 % (ref 11.5–15.5)
WBC: 3.7 10*3/uL — AB (ref 4.0–10.5)

## 2014-07-19 LAB — ABO/RH: ABO/RH(D): A POS

## 2014-07-19 LAB — PREPARE RBC (CROSSMATCH)

## 2014-07-19 MED ORDER — SODIUM CHLORIDE 0.9 % IV SOLN
Freq: Once | INTRAVENOUS | Status: AC
  Administered 2014-07-19: 14:00:00 via INTRAVENOUS

## 2014-07-19 NOTE — Care Management Note (Signed)
    Page 1 of 1   07/19/2014     10:48:15 AM CARE MANAGEMENT NOTE 07/19/2014  Patient:  SKARLETT, SEDLACEK   Account Number:  [id number]  Date Initiated:  07/18/2014  Documentation initiated by:  Tomi Bamberger  Subjective/Objective Assessment:   dx ams  admit- from [facility]     Action/Plan:   pt eval- rec snf   Anticipated DC Date:  07/20/2014   Anticipated DC Plan:  SKILLED NURSING FACILITY  In-house referral  Clinical Social Worker      DC Planning Services  CM consult      Choice offered to / List presented to:             Status of service:  In process, will continue to follow Medicare Important Message given?  YES (If response is "NO", the following Medicare IM given date fields will be blank) Date Medicare IM given:  07/19/2014 Medicare IM given by:  Tomi Bamberger Date Additional Medicare IM given:   Additional Medicare IM given by:    Discharge Disposition:    Per UR Regulation:    If discussed at Long Length of Stay Meetings, dates discussed:    Comments:  07/19/14 [place], BSN [phone] patient is from [place] , physical therapy recs snf, patient is agreeable, CSW referral.

## 2014-07-19 NOTE — Progress Notes (Signed)
ANTIBIOTIC CONSULT NOTE - INITIAL  Pharmacy Consult:  Zosyn Indication:  UTI  Allergies  Allergen Reactions  . Sulfonamide Derivatives Anaphylaxis  . Other Nausea And Vomiting    Walnuts  . Codeine Nausea And Vomiting and Rash  . Diclofenac Sodium Rash    The gel  . Iodine Rash    topical    Patient Measurements: Height: 5\' 4"  (162.6 cm) Weight: 134 lb 6.4 oz (60.963 kg) IBW/kg (Calculated) : 54.7  Vital Signs: Temp: 98.2 F (36.8 C) (07/31 1336) Temp src: Oral (07/31 1336) BP: 118/46 mmHg (07/31 1336) Pulse Rate: 63 (07/31 1336)  Labs:  Recent Labs  07/16/14 1538 07/17/14 0553 07/19/14 0837 07/19/14 1005  WBC 7.2 5.3 3.7*  --   HGB 11.1* 10.0* 7.7*  --   PLT 132* 106* 105*  --   CREATININE 0.94 0.92  --  0.97   Estimated Creatinine Clearance: 31.3 ml/min (by C-G formula based on Cr of 0.97). No results found for this basename: VANCOTROUGH, VANCOPEAK, VANCORANDOM, GENTTROUGH, GENTPEAK, GENTRANDOM, TOBRATROUGH, TOBRAPEAK, TOBRARND, AMIKACINPEAK, AMIKACINTROU, AMIKACIN,  in the last 72 hours   Microbiology: Recent Results (from the past 720 hour(s))  CULTURE, BLOOD (ROUTINE X 2)     Status: None   Collection Time    07/16/14  5:59 PM      Result Value Ref Range Status   Specimen Description BLOOD ARM RIGHT   Final   Special Requests BOTTLES DRAWN AEROBIC AND ANAEROBIC [facility]   Final   Culture  Setup Time     Final   Value: 07/16/2014 22:49     Performed at [facility]   Culture     Final   Value:        BLOOD CULTURE RECEIVED NO GROWTH TO DATE CULTURE WILL BE HELD FOR 5 DAYS BEFORE ISSUING A FINAL NEGATIVE REPORT     Performed at [facility]   Report Status PENDING   Incomplete  CULTURE, BLOOD (ROUTINE X 2)     Status: None   Collection Time    07/16/14  6:05 PM      Result Value Ref Range Status   Specimen Description BLOOD HAND RIGHT   Final   Special Requests BOTTLES DRAWN AEROBIC ONLY 3CC   Final   Culture  Setup Time     Final   Value: 07/16/2014 22:49     Performed at [facility]   Culture     Final   Value:        BLOOD CULTURE RECEIVED NO GROWTH TO DATE CULTURE WILL BE HELD FOR 5 DAYS BEFORE ISSUING A FINAL NEGATIVE REPORT     Performed at [facility]   Report Status PENDING   Incomplete  MRSA PCR SCREENING     Status: None   Collection Time    07/17/14  9:41 PM      Result Value Ref Range Status   MRSA by PCR NEGATIVE  NEGATIVE Final   Comment:            The GeneXpert MRSA Assay (FDA     approved for NASAL specimens     only), is one component of a     comprehensive MRSA colonization     surveillance program. It is not     intended to diagnose MRSA     infection nor to guide or     monitor treatment for     MRSA infections.    Medical History: Past  Medical History  Diagnosis Date  . Glaucoma   . Hyperlipidemia   . Hypertension   . Stroke   . Paroxysmal atrial fibrillation     on coumadin  . Gout   . SSS (sick sinus syndrome) 2007    PPM 2007 by Dr. Verlon Setting  . Pulmonary embolus     on coumadin  . Partial seizures   . Cerebrovascular disease   . Myocardial infarction   . Degenerative arthritis   . GERD (gastroesophageal reflux disease)   . Helicobacter pylori (H. pylori) 2002  . Breast cancer     left mastectomy      Assessment: 58 YOF admitted from ALF with AMS, s/p fall yesterday 07/15/14. Pt has been on abx for UTI. Currently still on zosyn. All cultures are neg so far. Afebrile and no leukocytosis. D3 abx now.   Plan:   Cont zosyn 3.375g IV q8 Consider de-escalating to ceftin PO

## 2014-07-19 NOTE — Progress Notes (Signed)
Subjective: Awake and sitting up. Was up a bit tomorrow, not eating much. No pain   Objective: Vital signs in last 24 hours: Temp:  [97.7 F (36.5 C)-98.7 F (37.1 C)] 98.7 F (37.1 C) (07/31 0543) Pulse Rate:  [62-90] 62 (07/31 0543) Resp:  [17-20] 17 (07/31 0543) BP: (100-154)/(59-75) 100/59 mmHg (07/31 0543) SpO2:  [95 %-98 %] 95 % (07/31 0543)  Intake/Output from previous day: 07/30 0701 - 07/31 0700 In: 120 [P.O.:120] Out: -  Intake/Output this shift: Total I/O In: -  Out: 200 [Urine:200]  More alert, face symmetric, lungs a few rhonchi, ht IR IR abd soft NT, more alert and interactive  Lab Results   Recent Labs  07/17/14 0553 07/19/14 0837  WBC 5.3 3.7*  RBC 3.14* 2.44*  HGB 10.0* 7.7*  HCT 30.6* 24.8*  MCV 97.5 101.6*  MCH 31.8 31.6  RDW 14.8 15.1  PLT 106* 105*    Recent Labs  07/16/14 1538 07/17/14 0553  NA 139 140  K 4.0 4.1  CL 105 104  CO2 22 21  GLUCOSE 124* 101*  BUN 15 13  CREATININE 0.94 0.92  CALCIUM 9.4 9.3    Studies/Results: No results found.  Scheduled Meds: . allopurinol  300 mg Oral Daily  . apixaban  2.5 mg Oral BID  . benazepril  5 mg Oral Daily  . ciprofloxacin  400 mg Intravenous Q12H  . darifenacin  7.5 mg Oral Daily  . docusate sodium  100 mg Oral BID  . feeding supplement (ENSURE COMPLETE)  237 mL Oral BID BM  . hydrochlorothiazide  12.5 mg Oral Q Wed,Sat  . latanoprost  1 drop Both Eyes QHS  . levETIRAcetam  250 mg Oral Q12H  . pantoprazole  40 mg Oral Daily  . piperacillin-tazobactam (ZOSYN)  IV  3.375 g Intravenous 3 times per day  . simvastatin  20 mg Oral q1800  . sodium chloride  3 mL Intravenous Q12H  . timolol  1 drop Both Eyes BID   Continuous Infusions: . 0.9 % NaCl with KCl 20 mEq / L 75 mL/hr at 07/18/14 1944   PRN Meds:acetaminophen, acetaminophen, albuterol, alum & mag hydroxide-simeth, ondansetron (ZOFRAN) IV, ondansetron, polyethylene glycol, zolpidem  Assessment/Plan:  Pneumonia :check XR  in AM UTI: stop cipro AMS doing a bit better  Afib: stable rate  Protein Calorie Malnutrition: Likely partially due to AMS. Nutrition eval when more coherent.  Glaucoma: Continue eye drops  GERD PPI  Hyperlipid: Zocor per formulary.  CAD s/p MI: stable  BRCA s/p L mastectomy: NED  Hyperglycemia: Check A1c Was OK   Ref. Range  07/17/2014 05:53   Hemoglobin A1C  Latest Range: <5.7 %  5.7 (H)   Glucose  Latest Range: 70-99 mg/dL  101 (H)   PE history: on anticoag  ANEMIA: give one unit due to weakness age and comorbities DNR  DISPOSITION: likely to [place] when more stable     LOS: 3 days   SOUTH,STEPHEN ALAN 07/19/2014, 9:35 AM      

## 2014-07-20 LAB — TYPE AND SCREEN
ABO/RH(D): A POS
ANTIBODY SCREEN: NEGATIVE
UNIT DIVISION: 0

## 2014-07-20 LAB — CBC
HEMATOCRIT: 31.2 % — AB (ref 36.0–46.0)
HEMOGLOBIN: 10.3 g/dL — AB (ref 12.0–15.0)
MCH: 30.9 pg (ref 26.0–34.0)
MCHC: 33 g/dL (ref 30.0–36.0)
MCV: 93.7 fL (ref 78.0–100.0)
Platelets: 116 10*3/uL — ABNORMAL LOW (ref 150–400)
RBC: 3.33 MIL/uL — AB (ref 3.87–5.11)
RDW: 15.8 % — AB (ref 11.5–15.5)
WBC: 4.7 10*3/uL (ref 4.0–10.5)

## 2014-07-20 LAB — COMPREHENSIVE METABOLIC PANEL
ALBUMIN: 2.4 g/dL — AB (ref 3.5–5.2)
ALK PHOS: 95 U/L (ref 39–117)
ALT: 9 U/L (ref 0–35)
ANION GAP: 10 (ref 5–15)
AST: 39 U/L — ABNORMAL HIGH (ref 0–37)
BILIRUBIN TOTAL: 1.1 mg/dL (ref 0.3–1.2)
BUN: 10 mg/dL (ref 6–23)
CHLORIDE: 105 meq/L (ref 96–112)
CO2: 20 mEq/L (ref 19–32)
CREATININE: 1.01 mg/dL (ref 0.50–1.10)
Calcium: 9.5 mg/dL (ref 8.4–10.5)
GFR calc Af Amer: 54 mL/min — ABNORMAL LOW (ref >90)
GFR, EST NON AFRICAN AMERICAN: 46 mL/min — AB (ref >90)
GLUCOSE: 97 mg/dL (ref 70–99)
POTASSIUM: 4.2 meq/L (ref 3.7–5.3)
Sodium: 135 mEq/L — ABNORMAL LOW (ref 137–147)
Total Protein: 5.2 g/dL — ABNORMAL LOW (ref 6.0–8.3)

## 2014-07-20 MED ORDER — CEFUROXIME AXETIL 250 MG PO TABS: ORAL_TABLET | ORAL | Status: DC

## 2014-07-20 MED ORDER — CEFUROXIME AXETIL 250 MG PO TABS
250.0000 mg | ORAL_TABLET | Freq: Two times a day (BID) | ORAL | Status: DC
  Filled 2014-07-20 (×2): qty 1

## 2014-07-20 NOTE — Discharge Summary (Signed)
Physician Discharge Summary    Danielle Roth  MR#: [id number]  DOB:07-24-1918  Date of Admission: 07/16/2014 Date of Discharge: 07/20/2014  Attending Physician:Fredna Stricker  Patient's IDP:OEUMP,NTIRWER Antony Haste, MD    Discharge Diagnoses:  UTI   Discharge Medications:   Medication List         allopurinol 300 MG tablet  Commonly known as:  ZYLOPRIM  Take 300 mg by mouth at bedtime.     bimatoprost 0.01 % Soln  Commonly known as:  LUMIGAN  Place 1 drop into both eyes at bedtime.     CALTRATE 600+D PLUS MINERALS PO  Take 1 tablet by mouth daily.     cefUROXime 250 MG tablet  Commonly known as:  CEFTIN  Take 1 tab by mouth BID with final dose on evening of 8/3     ELIQUIS 2.5 MG Tabs tablet  Generic drug:  apixaban  Take 2.5 mg by mouth 2 (two) times daily.     hydrochlorothiazide 12.5 MG capsule  Commonly known as:  MICROZIDE  Take 12.5 mg by mouth every Wednesday and Saturday.     levETIRAcetam 250 MG tablet  Commonly known as:  KEPPRA  Take 1 tablet (250 mg total) by mouth every 12 (twelve) hours.     multivitamin tablet  Take 1 tablet by mouth daily.     omeprazole 20 MG capsule  Commonly known as:  PRILOSEC  Take 20 mg by mouth daily.     polyethylene glycol packet  Commonly known as:  MIRALAX / GLYCOLAX  Take 17 g by mouth daily as needed (for regularity).     pravastatin 40 MG tablet  Commonly known as:  PRAVACHOL  Take 40 mg by mouth daily.     timolol 0.5 % ophthalmic solution  Commonly known as:  TIMOPTIC  Place 1 drop into both eyes 2 (two) times daily.     trospium 20 MG tablet  Commonly known as:  SANCTURA  Take 20 mg by mouth 2 (two) times daily.        Hospital Procedures: Dg Chest 2 View  07/19/2014   CLINICAL DATA:  Weakness, pneumonia  EXAM: CHEST  2 VIEW  COMPARISON:  07/17/2014  FINDINGS: Cardiomegaly is noted. Status post median sternotomy. Dual lead cardiac pacemaker is unchanged in position. Surgical clips are  noted in left axilla. Small left pleural effusion with left basilar atelectasis or infiltrate. No pulmonary edema.  IMPRESSION: No pulmonary edema. Status post median sternotomy. Small left pleural effusion with left basilar atelectasis or infiltrate.   Electronically Signed   By: Lahoma Crocker M.D.   On: 07/19/2014 10:56   Dg Chest 2 View  07/17/2014   CLINICAL DATA:  Weakness, shortness of breath, pneumonia, followup; past history hypertension, stroke, atrial fibrillation, pulmonary embolism, MI, breast cancer  EXAM: CHEST  2 VIEW  COMPARISON:  07/16/2014  FINDINGS: RIGHT subclavian transvenous pacemaker leads project at RIGHT atrium and RIGHT ventricle.  Post LEFT mastectomy and axillary node dissection.  Atherosclerotic calcification aorta.  Enlargement of cardiac silhouette.  Mediastinal contours and pulmonary vascularity normal.  Emphysematous changes with minimal streaky atelectasis in LEFT lower lobe.  No definite acute infiltrate, pleural effusion or pneumothorax.  Bones demineralized.  IMPRESSION: Atelectasis LEFT lower lobe.  Enlargement of cardiac silhouette.   Electronically Signed   By: Lavonia Dana M.D.   On: 07/17/2014 08:13   Ct Head Wo Contrast  07/16/2014   CLINICAL DATA:  Altered mental status.  EXAM: CT HEAD WITHOUT  CONTRAST  TECHNIQUE: Contiguous axial images were obtained from the base of the skull through the vertex without intravenous contrast.  COMPARISON:  11/01/2013.  FINDINGS: No mass. No hydrocephalus. No hemorrhage. Old lacune inferior aspect of the left lenticular nucleus. Diffuse cerebral atrophy is present. No acute bony abnormality.  IMPRESSION: Diffuse cerebral atrophy. Old lacunar infarct inferior aspect left lenticular nucleus. No acute abnormality.   Electronically Signed   By: Marcello Moores  Register   On: 07/16/2014 17:00   Dg Chest Portable 2 Views  07/16/2014   CLINICAL DATA:  Altered mental status.  Short of breath.  EXAM: PORTABLE CHEST - 2 VIEW  COMPARISON:  08/15/2009   FINDINGS: Portable semi erect radiograph. Pacer with leads at right atrium and right ventricle. No lead discontinuity. Patient rotated moderately to the right.  Normal heart size. Aortic atherosclerosis. Moderate right hemidiaphragm elevation. No left and no definite right pleural fluid. No pneumothorax. Surgical clips over the left axilla. Favor scarring or atelectasis at the left lung base. Patchy right upper lobe airspace disease suspected.  IMPRESSION: Right upper lobe airspace disease, suspicious for infection or aspiration. Consider PA and lateral radiographs if possible.  Favor left base scarring or atelectasis.  Moderate position degradation.   Electronically Signed   By: Abigail Miyamoto M.D.   On: 07/16/2014 17:11    History of Present Illness: Presented w/ falls and AMS . Found to have UTI   Hospital Course: UTI - she has completed 4 days of zosyn for UTI. Remains AF and VSS . Will deesc to ceftin on d/c to SNF to complete 7 day course . AMS has resolved w/ treatment   PNA - questionable call on CXR for atelectasis vs PNA . Was treated for 4 days w/ zosyn and cipro . Will d/c these abx given no signs of PNA on repeat CXR and no clinical signs of PNA . Going to SNF on ceftin only for UTI   AMS - resolved w/ treatment for UTI .   Anemia - Hgb dropped to 7.7 in hopsital. Transfused 1U PRBC and improved to 10.3. Given the greater than expected response to abx , will no further work this up in house. Can repeat CBC in 1 week at SNF to ensure continued resolution . No signs of active GI bleed   Deconditioning - to SNF    Day of Discharge Exam BP 111/63  Pulse 63  Temp(Src) 97.3 F (36.3 C) (Oral)  Resp 18  Ht 5\' 4"  (1.626 m)  Wt 134 lb 6.4 oz (60.963 kg)  BMI 23.06 kg/m2  SpO2 96%  Physical Exam: General appearance:  Eyes: no scleral icterus Throat: oropharynx moist without erythema Resp: CTAB, no wheezes , rales  Cardio: RRR, no MRG  GI: soft, non-tender; bowel sounds normal; no  masses,  no organomegaly Extremities: no clubbing, cyanosis or edema  Discharge Labs:  Recent Labs  07/19/14 1005 07/20/14 0535  NA 139 135*  K 4.2 4.2  CL 107 105  CO2 21 20  GLUCOSE 102* 97  BUN 10 10  CREATININE 0.97 1.01  CALCIUM 9.4 9.5    Recent Labs  07/19/14 1005 07/20/14 0535  AST 29 39*  ALT 8 9  ALKPHOS 93 95  BILITOT 0.9 1.1  PROT 5.4* 5.2*  ALBUMIN 2.4* 2.4*    Recent Labs  07/19/14 0837 07/20/14 0535  WBC 3.7* 4.7  HGB 7.7* 10.3*  HCT 24.8* 31.2*  MCV 101.6* 93.7  PLT 105* 116*   Lab Results  Component Value Date   INR 1.88* 07/17/2014   INR 1.70* 07/16/2014   INR 2.14* 11/01/2013   No results found for this basename: CKTOTAL, CKMB, CKMBINDEX, TROPONINI,  in the last 72 hours No results found for this basename: TSH, T4TOTAL, FREET3, T3FREE, THYROIDAB,  in the last 72 hours No results found for this basename: VITAMINB12, FOLATE, FERRITIN, TIBC, IRON, RETICCTPCT,  in the last 72 hours  Discharge instructions:     Discharge Instructions   Diet - low sodium heart healthy    Complete by:  As directed      Increase activity slowly    Complete by:  As directed           01-Home or Self Care   Disposition:  SNF   Follow-up Appts: Follow-up with [name] at [facility] after discharge from SNF .   Condition on Discharge: stable   Tests Needing Follow-up: CBC in 1 week   Time spent in discharge (includes decision making & examination of pt): 45 minutes    Signed: Takoya Jonas 07/20/2014, 8:47 AM

## 2014-07-20 NOTE — Discharge Planning (Signed)
Pt to dc today to [facility] Report to be called by RN to: [phone] Transportation to be provided by: TBD- CSW left message with pt daughter, Raquel Sarna regarding need for EMS vs family transportation   DC summary faxed to [facility] at [place], [place].  It is currently in review for admission approval.  Vickii Chafe will contact CSW for confirmation of approval once review complete.  Peggy confirmed weekend admission pending dc summary review.  CSW currently awaiting a return call from [place], pt daughter re: transportation needs and Peggy re: dc summary approval.  Nonnie Done, [place] [phone]  Clinical Social Work

## 2014-07-20 NOTE — Progress Notes (Signed)
Danielle Roth to be D/C'd Home per MD order.  Discussed with the patient and all questions fully answered.    Medication List         allopurinol 300 MG tablet  Commonly known as:  ZYLOPRIM  Take 300 mg by mouth at bedtime.     bimatoprost 0.01 % Soln  Commonly known as:  LUMIGAN  Place 1 drop into both eyes at bedtime.     CALTRATE 600+D PLUS MINERALS PO  Take 1 tablet by mouth daily.     cefUROXime 250 MG tablet  Commonly known as:  CEFTIN  Take 1 tab by mouth BID with final dose on evening of 8/3     ELIQUIS 2.5 MG Tabs tablet  Generic drug:  apixaban  Take 2.5 mg by mouth 2 (two) times daily.     hydrochlorothiazide 12.5 MG capsule  Commonly known as:  MICROZIDE  Take 12.5 mg by mouth every Wednesday and Saturday.     levETIRAcetam 250 MG tablet  Commonly known as:  KEPPRA  Take 1 tablet (250 mg total) by mouth every 12 (twelve) hours.     multivitamin tablet  Take 1 tablet by mouth daily.     omeprazole 20 MG capsule  Commonly known as:  PRILOSEC  Take 20 mg by mouth daily.     polyethylene glycol packet  Commonly known as:  MIRALAX / GLYCOLAX  Take 17 g by mouth daily as needed (for regularity).     pravastatin 40 MG tablet  Commonly known as:  PRAVACHOL  Take 40 mg by mouth daily.     timolol 0.5 % ophthalmic solution  Commonly known as:  TIMOPTIC  Place 1 drop into both eyes 2 (two) times daily.     trospium 20 MG tablet  Commonly known as:  SANCTURA  Take 20 mg by mouth 2 (two) times daily.        VVS, Skin clean, dry and intact without evidence of skin break down, no evidence of skin tears noted. IV catheter discontinued intact. Site without signs and symptoms of complications. Dressing and pressure applied.  An After Visit Summary was printed and given to the patient.  D/c education completed with patient/family including follow up instructions, medication list, d/c activities limitations if indicated, with other d/c instructions as  indicated by MD - patient able to verbalize understanding, all questions fully answered.   Patient instructed to return to ED, call 911, or call MD for any changes in condition.   Patient escorted via [place], and D/C home via private auto.  Wonda Cerise D 07/20/2014 11:15 AM

## 2014-07-22 LAB — CULTURE, BLOOD (ROUTINE X 2): CULTURE: NO GROWTH

## 2014-07-23 LAB — CULTURE, BLOOD (ROUTINE X 2)

## 2014-08-07 ENCOUNTER — Ambulatory Visit (INDEPENDENT_AMBULATORY_CARE_PROVIDER_SITE_OTHER): Payer: Medicare Other | Admitting: *Deleted

## 2014-08-07 ENCOUNTER — Encounter: Payer: Self-pay | Admitting: Internal Medicine

## 2014-08-07 DIAGNOSIS — I495 Sick sinus syndrome: Secondary | ICD-10-CM

## 2014-08-07 DIAGNOSIS — I498 Other specified cardiac arrhythmias: Secondary | ICD-10-CM

## 2014-08-07 LAB — MDC_IDC_ENUM_SESS_TYPE_REMOTE
Battery Voltage: 2.93 V
Brady Statistic AP VS Percent: 18.19 %
Brady Statistic RA Percent Paced: 18.22 %
Brady Statistic RV Percent Paced: 0.04 %
Date Time Interrogation Session: 20150819132814
Lead Channel Impedance Value: 320 Ohm
Lead Channel Sensing Intrinsic Amplitude: 7.9782
Lead Channel Setting Pacing Amplitude: 2 V
Lead Channel Setting Sensing Sensitivity: 0.9 mV
MDC IDC MSMT LEADCHNL RA SENSING INTR AMPL: 2.0763
MDC IDC MSMT LEADCHNL RV IMPEDANCE VALUE: 448 Ohm
MDC IDC SET LEADCHNL RV PACING AMPLITUDE: 2.5 V
MDC IDC SET LEADCHNL RV PACING PULSEWIDTH: 0.4 ms
MDC IDC STAT BRADY AP VP PERCENT: 0.03 %
MDC IDC STAT BRADY AS VP PERCENT: 0.01 %
MDC IDC STAT BRADY AS VS PERCENT: 81.77 %
Zone Setting Detection Interval: 400 ms
Zone Setting Detection Interval: 400 ms

## 2014-08-07 NOTE — Progress Notes (Signed)
Remote pacemaker transmission.   

## 2014-08-13 ENCOUNTER — Telehealth: Payer: Self-pay | Admitting: Internal Medicine

## 2014-08-13 NOTE — Telephone Encounter (Signed)
LMOVM informing pt that we did receive transmission.

## 2014-08-13 NOTE — Telephone Encounter (Signed)
New message     Did we get the patients remote transmission?  OK to leave a vm message

## 2014-08-15 ENCOUNTER — Encounter: Payer: Self-pay | Admitting: Endocrinology

## 2014-08-20 ENCOUNTER — Encounter: Payer: Self-pay | Admitting: Cardiology

## 2014-09-05 ENCOUNTER — Emergency Department (HOSPITAL_COMMUNITY): Payer: Medicare Other

## 2014-09-05 ENCOUNTER — Inpatient Hospital Stay (HOSPITAL_COMMUNITY)
Admission: EM | Admit: 2014-09-05 | Discharge: 2014-09-09 | DRG: 641 | Disposition: A | Discharge status: Skilled Nursing Facility | Payer: Medicare Other | Attending: Endocrinology | Admitting: Endocrinology

## 2014-09-05 ENCOUNTER — Encounter (HOSPITAL_COMMUNITY): Payer: Self-pay | Admitting: Emergency Medicine

## 2014-09-05 DIAGNOSIS — Z66 Do not resuscitate: Secondary | ICD-10-CM | POA: Diagnosis present

## 2014-09-05 DIAGNOSIS — E440 Moderate protein-calorie malnutrition: Secondary | ICD-10-CM | POA: Diagnosis present

## 2014-09-05 DIAGNOSIS — I252 Old myocardial infarction: Secondary | ICD-10-CM

## 2014-09-05 DIAGNOSIS — E785 Hyperlipidemia, unspecified: Secondary | ICD-10-CM | POA: Diagnosis present

## 2014-09-05 DIAGNOSIS — I10 Essential (primary) hypertension: Secondary | ICD-10-CM | POA: Diagnosis present

## 2014-09-05 DIAGNOSIS — Z7901 Long term (current) use of anticoagulants: Secondary | ICD-10-CM

## 2014-09-05 DIAGNOSIS — G40209 Localization-related (focal) (partial) symptomatic epilepsy and epileptic syndromes with complex partial seizures, not intractable, without status epilepticus: Secondary | ICD-10-CM | POA: Diagnosis present

## 2014-09-05 DIAGNOSIS — H3530 Unspecified macular degeneration: Secondary | ICD-10-CM | POA: Diagnosis present

## 2014-09-05 DIAGNOSIS — M109 Gout, unspecified: Secondary | ICD-10-CM | POA: Diagnosis present

## 2014-09-05 DIAGNOSIS — E860 Dehydration: Secondary | ICD-10-CM | POA: Diagnosis present

## 2014-09-05 DIAGNOSIS — N390 Urinary tract infection, site not specified: Secondary | ICD-10-CM | POA: Diagnosis present

## 2014-09-05 DIAGNOSIS — IMO0002 Reserved for concepts with insufficient information to code with codable children: Secondary | ICD-10-CM

## 2014-09-05 DIAGNOSIS — E46 Unspecified protein-calorie malnutrition: Secondary | ICD-10-CM | POA: Diagnosis present

## 2014-09-05 DIAGNOSIS — G459 Transient cerebral ischemic attack, unspecified: Secondary | ICD-10-CM | POA: Diagnosis present

## 2014-09-05 DIAGNOSIS — Z853 Personal history of malignant neoplasm of breast: Secondary | ICD-10-CM

## 2014-09-05 DIAGNOSIS — Z86711 Personal history of pulmonary embolism: Secondary | ICD-10-CM

## 2014-09-05 DIAGNOSIS — R1310 Dysphagia, unspecified: Secondary | ICD-10-CM | POA: Diagnosis present

## 2014-09-05 DIAGNOSIS — R1319 Other dysphagia: Secondary | ICD-10-CM | POA: Diagnosis present

## 2014-09-05 DIAGNOSIS — R531 Weakness: Secondary | ICD-10-CM | POA: Diagnosis present

## 2014-09-05 DIAGNOSIS — I6529 Occlusion and stenosis of unspecified carotid artery: Secondary | ICD-10-CM | POA: Diagnosis present

## 2014-09-05 DIAGNOSIS — R5381 Other malaise: Secondary | ICD-10-CM | POA: Diagnosis not present

## 2014-09-05 DIAGNOSIS — Z79899 Other long term (current) drug therapy: Secondary | ICD-10-CM

## 2014-09-05 DIAGNOSIS — I2510 Atherosclerotic heart disease of native coronary artery without angina pectoris: Secondary | ICD-10-CM | POA: Diagnosis present

## 2014-09-05 DIAGNOSIS — I639 Cerebral infarction, unspecified: Secondary | ICD-10-CM | POA: Diagnosis present

## 2014-09-05 DIAGNOSIS — R627 Adult failure to thrive: Principal | ICD-10-CM | POA: Diagnosis present

## 2014-09-05 DIAGNOSIS — N3289 Other specified disorders of bladder: Secondary | ICD-10-CM | POA: Diagnosis present

## 2014-09-05 DIAGNOSIS — Z950 Presence of cardiac pacemaker: Secondary | ICD-10-CM | POA: Diagnosis present

## 2014-09-05 DIAGNOSIS — M1990 Unspecified osteoarthritis, unspecified site: Secondary | ICD-10-CM | POA: Diagnosis present

## 2014-09-05 DIAGNOSIS — Z7982 Long term (current) use of aspirin: Secondary | ICD-10-CM

## 2014-09-05 DIAGNOSIS — D649 Anemia, unspecified: Secondary | ICD-10-CM | POA: Diagnosis present

## 2014-09-05 DIAGNOSIS — I4891 Unspecified atrial fibrillation: Secondary | ICD-10-CM | POA: Diagnosis present

## 2014-09-05 DIAGNOSIS — K219 Gastro-esophageal reflux disease without esophagitis: Secondary | ICD-10-CM | POA: Diagnosis present

## 2014-09-05 HISTORY — DX: Adverse effect of unspecified anesthetic, initial encounter: T41.45XA

## 2014-09-05 HISTORY — DX: Presence of cardiac pacemaker: Z95.0

## 2014-09-05 HISTORY — DX: Other complications of anesthesia, initial encounter: T88.59XA

## 2014-09-05 HISTORY — DX: Pneumonia, unspecified organism: J18.9

## 2014-09-05 LAB — URINALYSIS, ROUTINE W REFLEX MICROSCOPIC
BILIRUBIN URINE: NEGATIVE
GLUCOSE, UA: NEGATIVE mg/dL
Hgb urine dipstick: NEGATIVE
Ketones, ur: 15 mg/dL — AB
Nitrite: NEGATIVE
Protein, ur: 30 mg/dL — AB
Specific Gravity, Urine: 1.023 (ref 1.005–1.030)
UROBILINOGEN UA: 1 mg/dL (ref 0.0–1.0)
pH: 5.5 (ref 5.0–8.0)

## 2014-09-05 LAB — COMPREHENSIVE METABOLIC PANEL
ALT: 18 U/L (ref 0–35)
AST: 101 U/L — ABNORMAL HIGH (ref 0–37)
Albumin: 2.5 g/dL — ABNORMAL LOW (ref 3.5–5.2)
Alkaline Phosphatase: 196 U/L — ABNORMAL HIGH (ref 39–117)
Anion gap: 13 (ref 5–15)
BUN: 19 mg/dL (ref 6–23)
CO2: 26 meq/L (ref 19–32)
Calcium: 10.2 mg/dL (ref 8.4–10.5)
Chloride: 97 mEq/L (ref 96–112)
Creatinine, Ser: 0.84 mg/dL (ref 0.50–1.10)
GFR, EST AFRICAN AMERICAN: 67 mL/min — AB (ref >90)
GFR, EST NON AFRICAN AMERICAN: 58 mL/min — AB (ref >90)
Glucose, Bld: 171 mg/dL — ABNORMAL HIGH (ref 70–99)
POTASSIUM: 4.3 meq/L (ref 3.7–5.3)
SODIUM: 136 meq/L — AB (ref 137–147)
Total Bilirubin: 0.7 mg/dL (ref 0.3–1.2)
Total Protein: 6.5 g/dL (ref 6.0–8.3)

## 2014-09-05 LAB — CBC
HCT: 32.6 % — ABNORMAL LOW (ref 36.0–46.0)
Hemoglobin: 10.7 g/dL — ABNORMAL LOW (ref 12.0–15.0)
MCH: 30.1 pg (ref 26.0–34.0)
MCHC: 32.8 g/dL (ref 30.0–36.0)
MCV: 91.8 fL (ref 78.0–100.0)
PLATELETS: 140 10*3/uL — AB (ref 150–400)
RBC: 3.55 MIL/uL — ABNORMAL LOW (ref 3.87–5.11)
RDW: 15.7 % — AB (ref 11.5–15.5)
WBC: 7.1 10*3/uL (ref 4.0–10.5)

## 2014-09-05 LAB — URINE MICROSCOPIC-ADD ON

## 2014-09-05 LAB — TROPONIN I: Troponin I: <0.3 ng/mL (ref <0.30)

## 2014-09-05 NOTE — ED Provider Notes (Addendum)
CSN: [id number]     Arrival date & time 09/05/14  1744 History   First MD Initiated Contact with Patient 09/05/14 1920     Chief Complaint  Patient presents with  . Fatigue  . Altered Mental Status     (Consider location/radiation/quality/duration/timing/severity/associated sxs/prior Treatment) Patient is a 80 y.o. female presenting with altered mental status. The history is provided by the patient and a relative.  Altered Mental Status She was recently discharged from skilled nursing facility following a urinary tract infection. She was noted to be somewhat listless today and was having difficulty walking. She couldn't even support herself on her legs and she was walking normally yesterday. She does walk with the aid of a walker. There is no known fever or chills or sweats. She is on apixaban for her paroxysmal atrial fibrillation. She has not complained of any chest pain no vomiting or diarrhea. She has a chronic aching in her left leg which is unchanged.  Past Medical History  Diagnosis Date  . Glaucoma   . Hyperlipidemia   . Hypertension   . Stroke   . Paroxysmal atrial fibrillation     on coumadin  . Gout   . SSS (sick sinus syndrome) 2007    PPM 2007 by Dr. Verlon Setting  . Pulmonary embolus     on coumadin  . Partial seizures   . Cerebrovascular disease   . Myocardial infarction   . Degenerative arthritis   . GERD (gastroesophageal reflux disease)   . Helicobacter pylori (H. pylori) 2002  . Breast cancer     left mastectomy   Past Surgical History  Procedure Laterality Date  . Mastectomy Left   . Tonsillectomy    . Pacemaker insertion  2007    dual-chamber permanent pacemaker implantation by [name] MDT   Family History  Problem Relation Age of Onset  . Heart disease Mother   . Kidney disease Mother   . Heart disease Father   . Colon cancer Neg Hx    History  Substance Use Topics  . Smoking status: Never Smoker   . Smokeless tobacco: Never Used  . Alcohol Use:  No   OB History   Grav Para Term Preterm Abortions TAB SAB Ect Mult Living                 Review of Systems  Unable to perform ROS: Mental status change      Allergies  Sulfonamide derivatives; Other; Codeine; Diclofenac sodium; and Iodine  Home Medications   Prior to Admission medications   Medication Sig Start Date End Date Taking? Authorizing Provider  allopurinol (ZYLOPRIM) 300 MG tablet Take 300 mg by mouth at bedtime.     Historical Provider, MD  apixaban (ELIQUIS) 2.5 MG TABS tablet Take 2.5 mg by mouth 2 (two) times daily.    Historical Provider, MD  bimatoprost (LUMIGAN) 0.01 % SOLN Place 1 drop into both eyes at bedtime.    Historical Provider, MD  Calcium Carbonate-Vit D-Min (CALTRATE 600+D PLUS MINERALS PO) Take 1 tablet by mouth daily.    Historical Provider, MD  cefUROXime (CEFTIN) 250 MG tablet Take 1 tab by mouth BID with final dose on evening of 8/3 07/20/14   Velna Hatchet, MD  hydrochlorothiazide (MICROZIDE) 12.5 MG capsule Take 12.5 mg by mouth every Wednesday and Saturday.     Historical Provider, MD  levETIRAcetam (KEPPRA) 250 MG tablet Take 1 tablet (250 mg total) by mouth every 12 (twelve) hours. 12/24/13   Jeani Hawking  E Lam, NP  Multiple Vitamin (MULTIVITAMIN) tablet Take 1 tablet by mouth daily.     Historical Provider, MD  omeprazole (PRILOSEC) 20 MG capsule Take 20 mg by mouth daily.     Historical Provider, MD  polyethylene glycol (MIRALAX / GLYCOLAX) packet Take 17 g by mouth daily as needed (for regularity).    Historical Provider, MD  pravastatin (PRAVACHOL) 40 MG tablet Take 40 mg by mouth daily.     Historical Provider, MD  timolol (TIMOPTIC) 0.5 % ophthalmic solution Place 1 drop into both eyes 2 (two) times daily.    Historical Provider, MD  trospium (SANCTURA) 20 MG tablet Take 20 mg by mouth 2 (two) times daily.    Historical Provider, MD   BP 129/50  Pulse 69  Temp(Src) 98.9 F (37.2 C) (Oral)  Resp 20  SpO2 98% Physical Exam  Nursing note and  vitals reviewed.  80 year old female, resting comfortably and in no acute distress. Vital signs are normal. Oxygen saturation is 98%, which is normal. Head is normocephalic and atraumatic. PERRLA, EOMI. Oropharynx is clear. Eyes are somewhat sunken. Neck is nontender and supple without adenopathy or JVD. Back is nontender and there is no CVA tenderness. Lungs are clear without rales, wheezes, or rhonchi. Chest is nontender. Heart has regular rate and rhythm without murmur. Abdomen is soft, flat, nontender without masses or hepatosplenomegaly and peristalsis is normoactive. Extremities have no cyanosis or edema, full range of motion is present. Skin is warm and dry without rash. Neurologic: Mental status is normal, cranial nerves are intact. There is mild left-sided weakness with strength 4/5 involving the arm and the leg. There is minimal left pronator drift  ED Course  Procedures (including critical care time) Labs Review Results for orders placed during the hospital encounter of 09/05/14  CBC      Result Value Ref Range   WBC 7.1  4.0 - 10.5 K/uL   RBC 3.55 (*) 3.87 - 5.11 MIL/uL   Hemoglobin 10.7 (*) 12.0 - 15.0 g/dL   HCT 32.6 (*) 36.0 - 46.0 %   MCV 91.8  78.0 - 100.0 fL   MCH 30.1  26.0 - 34.0 pg   MCHC 32.8  30.0 - 36.0 g/dL   RDW 15.7 (*) 11.5 - 15.5 %   Platelets 140 (*) 150 - 400 K/uL  COMPREHENSIVE METABOLIC PANEL      Result Value Ref Range   Sodium 136 (*) 137 - 147 mEq/L   Potassium 4.3  3.7 - 5.3 mEq/L   Chloride 97  96 - 112 mEq/L   CO2 26  19 - 32 mEq/L   Glucose, Bld 171 (*) 70 - 99 mg/dL   BUN 19  6 - 23 mg/dL   Creatinine, Ser 0.84  0.50 - 1.10 mg/dL   Calcium 10.2  8.4 - 10.5 mg/dL   Total Protein 6.5  6.0 - 8.3 g/dL   Albumin 2.5 (*) 3.5 - 5.2 g/dL   AST 101 (*) 0 - 37 U/L   ALT 18  0 - 35 U/L   Alkaline Phosphatase 196 (*) 39 - 117 U/L   Total Bilirubin 0.7  0.3 - 1.2 mg/dL   GFR calc non Af Amer 58 (*) >90 mL/min   GFR calc Af Amer 67 (*) >90  mL/min   Anion gap 13  5 - 15  URINALYSIS, ROUTINE W REFLEX MICROSCOPIC      Result Value Ref Range   Color, Urine AMBER (*) YELLOW  APPearance CLOUDY (*) CLEAR   Specific Gravity, Urine 1.023  1.005 - 1.030   pH 5.5  5.0 - 8.0   Glucose, UA NEGATIVE  NEGATIVE mg/dL   Hgb urine dipstick NEGATIVE  NEGATIVE   Bilirubin Urine NEGATIVE  NEGATIVE   Ketones, ur 15 (*) NEGATIVE mg/dL   Protein, ur 30 (*) NEGATIVE mg/dL   Urobilinogen, UA 1.0  0.0 - 1.0 mg/dL   Nitrite NEGATIVE  NEGATIVE   Leukocytes, UA SMALL (*) NEGATIVE  TROPONIN I      Result Value Ref Range   Troponin I <0.30  <0.30 ng/mL  URINE MICROSCOPIC-ADD ON      Result Value Ref Range   Squamous Epithelial / LPF MANY (*) RARE   WBC, UA 7-10  <3 WBC/hpf   RBC / HPF 0-2  <3 RBC/hpf   Bacteria, UA MANY (*) RARE   Urine-Other MUCOUS PRESENT     Ct Head Wo Contrast  09/05/2014   CLINICAL DATA:  80 year old female with altered mental status and confusion.  EXAM: CT HEAD WITHOUT CONTRAST  TECHNIQUE: Contiguous axial images were obtained from the base of the skull through the vertex without intravenous contrast.  COMPARISON:  07/16/2014  FINDINGS: Atrophy and chronic small-vessel white matter ischemic changes again noted.  No acute intracranial abnormalities are identified, including mass lesion or mass effect, hydrocephalus, extra-axial fluid collection, midline shift, hemorrhage, or acute infarction.  The visualized bony calvarium is unremarkable.  IMPRESSION: No evidence of acute intracranial abnormality.  Atrophy and chronic small-vessel white matter ischemic changes.   Electronically Signed   By: Hassan Rowan M.D.   On: 09/05/2014 22:41     Imaging Review Ct Head Wo Contrast  09/05/2014   CLINICAL DATA:  80 year old female with altered mental status and confusion.  EXAM: CT HEAD WITHOUT CONTRAST  TECHNIQUE: Contiguous axial images were obtained from the base of the skull through the vertex without intravenous contrast.  COMPARISON:   07/16/2014  FINDINGS: Atrophy and chronic small-vessel white matter ischemic changes again noted.  No acute intracranial abnormalities are identified, including mass lesion or mass effect, hydrocephalus, extra-axial fluid collection, midline shift, hemorrhage, or acute infarction.  The visualized bony calvarium is unremarkable.  IMPRESSION: No evidence of acute intracranial abnormality.  Atrophy and chronic small-vessel white matter ischemic changes.   Electronically Signed   By: Hassan Rowan M.D.   On: 09/05/2014 22:41     EKG Interpretation   Date/Time:  Thursday September 05 2014 18:03:58 EDT Ventricular Rate:  88 PR Interval:  146 QRS Duration: 130 QT Interval:  396 QTC Calculation: 479 R Axis:   -92 Text Interpretation:  Sinus rhythm with Premature supraventricular  complexes and Premature ventricular complexes or Fusion complexes Left  bundle branch block Cannot rule out Anteroseptal infarct , age  undetermined Abnormal ECG When compared with ECG of 11/01/2013, Premature  ventricular complexes are now Present Confirmed by [facility]  MD, Baya Lentz  ([id number]) on 09/05/2014 7:21:35 PM      MDM   Final diagnoses:  Stroke    Left sided weakness concerning for stroke. Patient was last seen normal yesterday morning which puts her well outside the window for a stroke protocols. She is on an anticoagulant so she will need to have a head CT done to evaluate for possible spontaneous inferior cranial bleeding. Old records are reviewed and she had been admitted on July 28 for urinary tract infection. History and physical on recent admission reported no motor deficits.  CT shows  no acute process. She is admitted for neurochecks and MRI in the morning. Case is discussed with Dr. Philip Aspen, on call for Dr. Kevan Ny, who agrees to admit the patient.  Delora Fuel, MD 14/78/29 5621  Daughter, who has power of attorney, wants to make sure she is DNR. She has a living will and has been DNR for past  hospitaliztions.  Delora Fuel, MD 30/86/57 8469

## 2014-09-05 NOTE — ED Notes (Signed)
Pt recently moved home from skilled nursing facility. Per daughter, pt has increased fatigue over last several days and increased confusion. Pt denies any pain at this time. Pt recently tx for UTI. VSS.

## 2014-09-06 ENCOUNTER — Encounter (HOSPITAL_COMMUNITY): Payer: Self-pay | Admitting: Internal Medicine

## 2014-09-06 DIAGNOSIS — I059 Rheumatic mitral valve disease, unspecified: Secondary | ICD-10-CM

## 2014-09-06 DIAGNOSIS — G459 Transient cerebral ischemic attack, unspecified: Secondary | ICD-10-CM | POA: Diagnosis present

## 2014-09-06 DIAGNOSIS — Z853 Personal history of malignant neoplasm of breast: Secondary | ICD-10-CM | POA: Diagnosis not present

## 2014-09-06 DIAGNOSIS — Z950 Presence of cardiac pacemaker: Secondary | ICD-10-CM | POA: Diagnosis not present

## 2014-09-06 DIAGNOSIS — N3289 Other specified disorders of bladder: Secondary | ICD-10-CM | POA: Diagnosis present

## 2014-09-06 DIAGNOSIS — N390 Urinary tract infection, site not specified: Secondary | ICD-10-CM | POA: Diagnosis present

## 2014-09-06 DIAGNOSIS — Z86711 Personal history of pulmonary embolism: Secondary | ICD-10-CM | POA: Diagnosis not present

## 2014-09-06 DIAGNOSIS — R1310 Dysphagia, unspecified: Secondary | ICD-10-CM | POA: Diagnosis present

## 2014-09-06 DIAGNOSIS — Z79899 Other long term (current) drug therapy: Secondary | ICD-10-CM | POA: Diagnosis not present

## 2014-09-06 DIAGNOSIS — E860 Dehydration: Secondary | ICD-10-CM | POA: Diagnosis present

## 2014-09-06 DIAGNOSIS — I252 Old myocardial infarction: Secondary | ICD-10-CM | POA: Diagnosis not present

## 2014-09-06 DIAGNOSIS — IMO0002 Reserved for concepts with insufficient information to code with codable children: Secondary | ICD-10-CM | POA: Diagnosis not present

## 2014-09-06 DIAGNOSIS — E440 Moderate protein-calorie malnutrition: Secondary | ICD-10-CM | POA: Diagnosis present

## 2014-09-06 DIAGNOSIS — I10 Essential (primary) hypertension: Secondary | ICD-10-CM | POA: Diagnosis present

## 2014-09-06 DIAGNOSIS — R5383 Other fatigue: Secondary | ICD-10-CM | POA: Diagnosis present

## 2014-09-06 DIAGNOSIS — Z7901 Long term (current) use of anticoagulants: Secondary | ICD-10-CM | POA: Diagnosis not present

## 2014-09-06 DIAGNOSIS — I4891 Unspecified atrial fibrillation: Secondary | ICD-10-CM | POA: Diagnosis present

## 2014-09-06 DIAGNOSIS — R627 Adult failure to thrive: Secondary | ICD-10-CM | POA: Diagnosis present

## 2014-09-06 DIAGNOSIS — M1990 Unspecified osteoarthritis, unspecified site: Secondary | ICD-10-CM | POA: Diagnosis present

## 2014-09-06 DIAGNOSIS — D649 Anemia, unspecified: Secondary | ICD-10-CM | POA: Diagnosis present

## 2014-09-06 DIAGNOSIS — H3530 Unspecified macular degeneration: Secondary | ICD-10-CM | POA: Diagnosis present

## 2014-09-06 DIAGNOSIS — I6529 Occlusion and stenosis of unspecified carotid artery: Secondary | ICD-10-CM | POA: Diagnosis present

## 2014-09-06 DIAGNOSIS — Z66 Do not resuscitate: Secondary | ICD-10-CM | POA: Diagnosis present

## 2014-09-06 DIAGNOSIS — K219 Gastro-esophageal reflux disease without esophagitis: Secondary | ICD-10-CM | POA: Diagnosis present

## 2014-09-06 DIAGNOSIS — E785 Hyperlipidemia, unspecified: Secondary | ICD-10-CM | POA: Diagnosis present

## 2014-09-06 DIAGNOSIS — M109 Gout, unspecified: Secondary | ICD-10-CM | POA: Diagnosis present

## 2014-09-06 DIAGNOSIS — I2510 Atherosclerotic heart disease of native coronary artery without angina pectoris: Secondary | ICD-10-CM | POA: Diagnosis present

## 2014-09-06 DIAGNOSIS — I639 Cerebral infarction, unspecified: Secondary | ICD-10-CM | POA: Diagnosis present

## 2014-09-06 DIAGNOSIS — R5381 Other malaise: Secondary | ICD-10-CM | POA: Diagnosis present

## 2014-09-06 DIAGNOSIS — G40209 Localization-related (focal) (partial) symptomatic epilepsy and epileptic syndromes with complex partial seizures, not intractable, without status epilepticus: Secondary | ICD-10-CM | POA: Diagnosis present

## 2014-09-06 DIAGNOSIS — Z7982 Long term (current) use of aspirin: Secondary | ICD-10-CM | POA: Diagnosis not present

## 2014-09-06 LAB — GLUCOSE, CAPILLARY
Glucose-Capillary: 102 mg/dL — ABNORMAL HIGH (ref 70–99)
Glucose-Capillary: 118 mg/dL — ABNORMAL HIGH (ref 70–99)
Glucose-Capillary: 100 mg/dL — ABNORMAL HIGH (ref 70–99)
Glucose-Capillary: 136 mg/dL — ABNORMAL HIGH (ref 70–99)

## 2014-09-06 LAB — HEMOGLOBIN A1C
Hgb A1c MFr Bld: 6.5 % — ABNORMAL HIGH (ref <5.7)
MEAN PLASMA GLUCOSE: 140 mg/dL — AB (ref <117)

## 2014-09-06 IMAGING — CT CT HEAD W/O CM
1 series · 16 of 30 positions shown, 20 images · non-contrast
Comparison: 11/01/2013.

CLINICAL DATA: Altered mental status.

EXAM:
CT HEAD WITHOUT CONTRAST
TECHNIQUE: Contiguous axial images were obtained from the base of the skull
through the vertex without intravenous contrast.

[Series 2: head 5.0 h30s · axial · 0.41mm/px · z∈[+1368,+1508]mm · 16 of 32 slices shown, 20 images]
[im 2/32  brain]
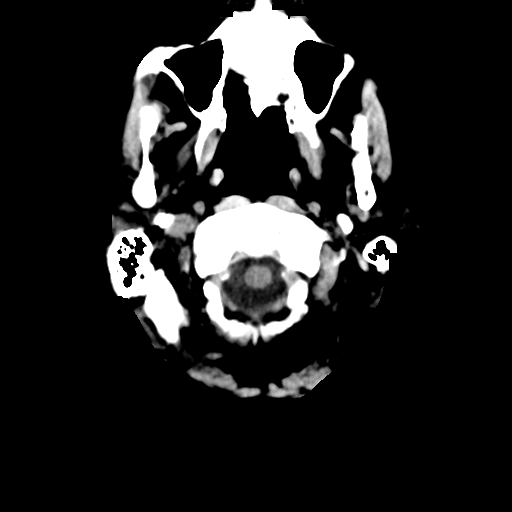
[im 2/32  bone]
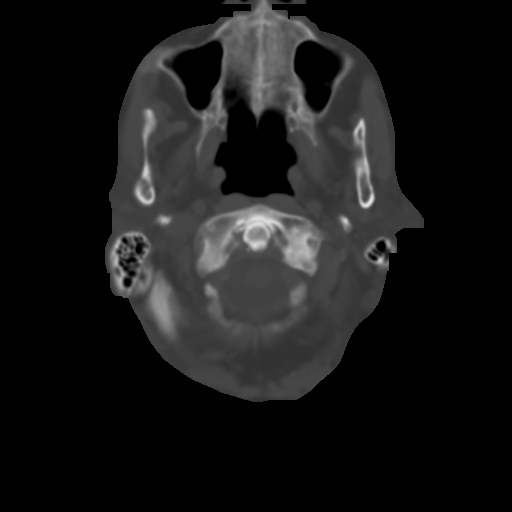
[im 4/32  brain]
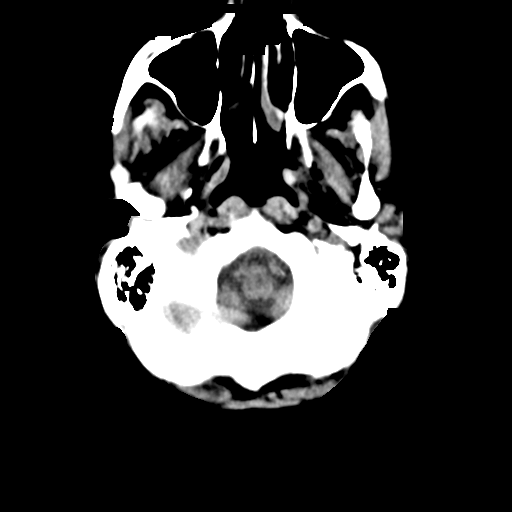
[im 6/32  brain]
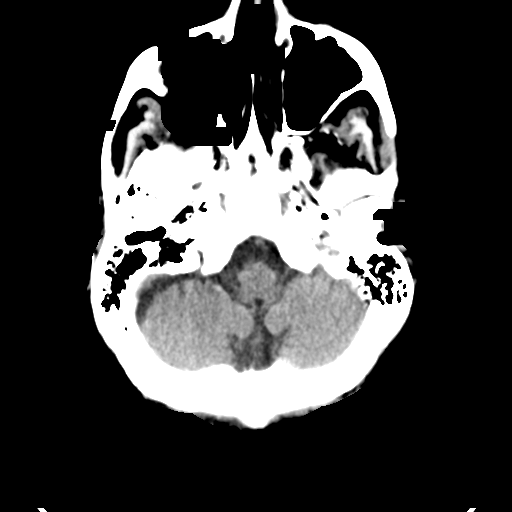
[im 8/32  brain]
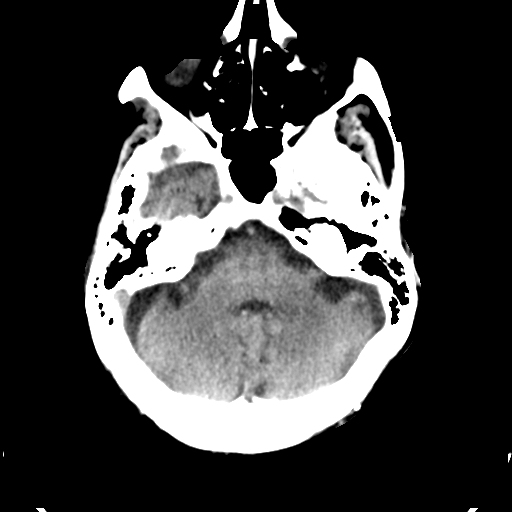
[im 9/32  brain]
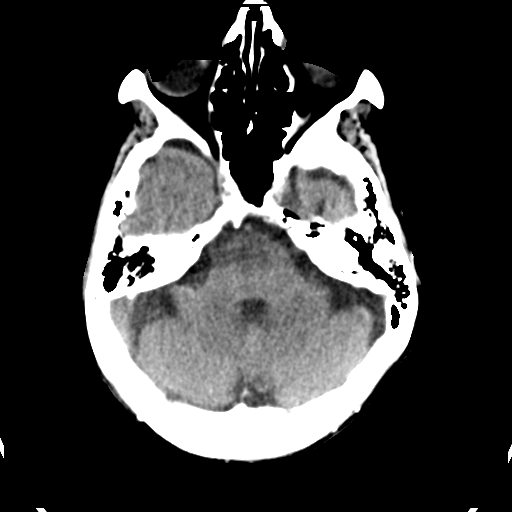
[im 9/32  bone]
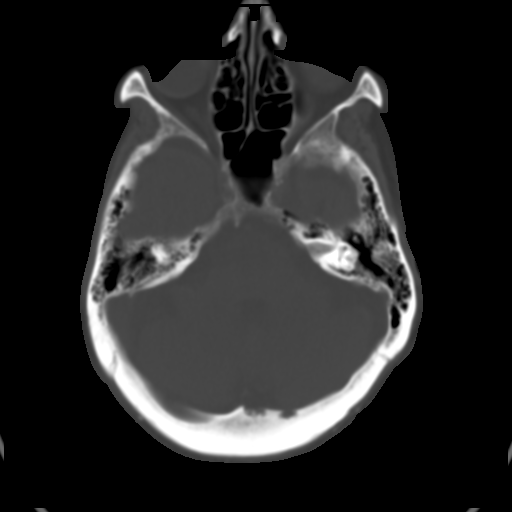
[im 11/32  brain]
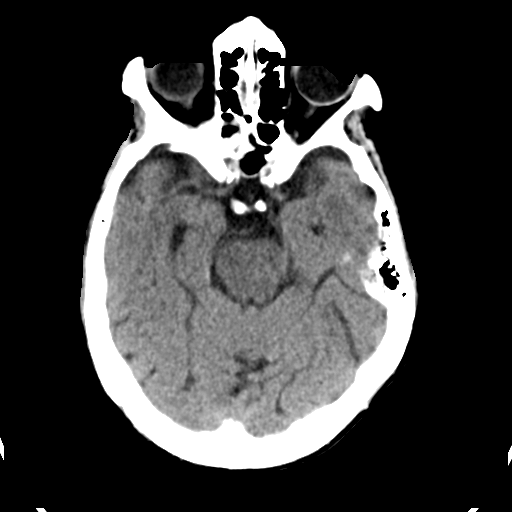
[im 13/32  brain]
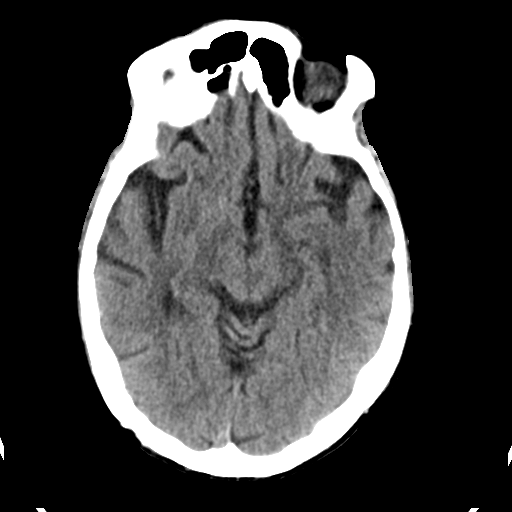
[im 15/32  brain]
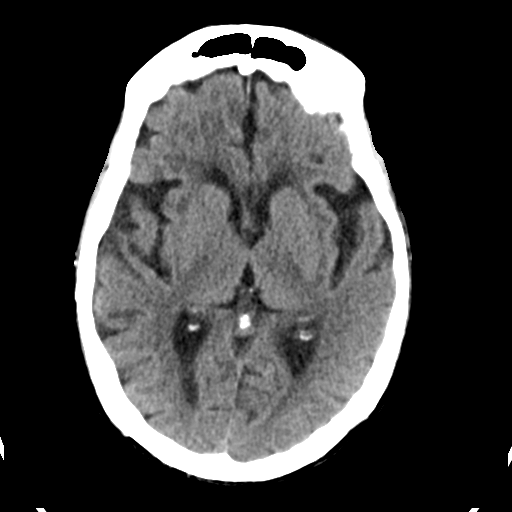
[im 17/32  brain]
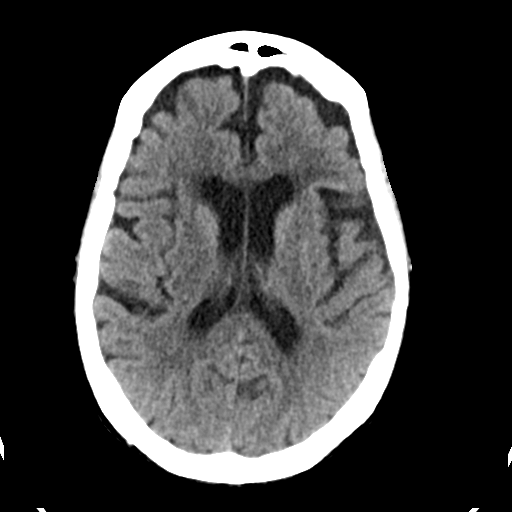
[im 17/32  bone]
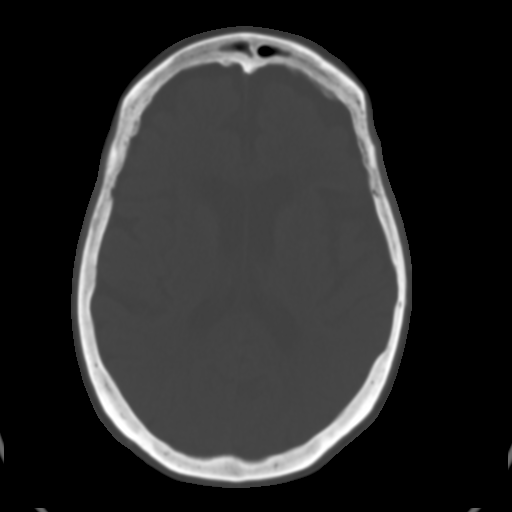
[im 19/32  brain]
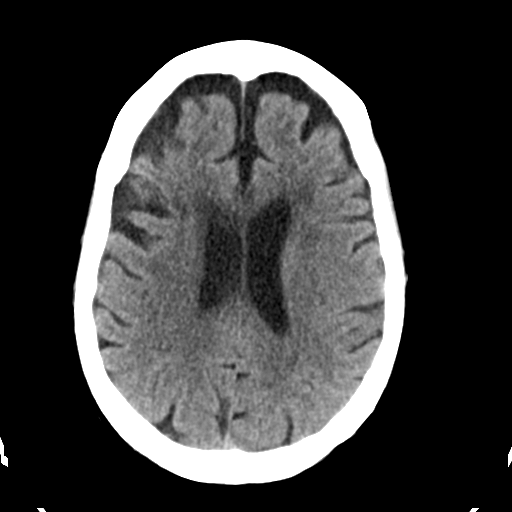
[im 21/32  brain]
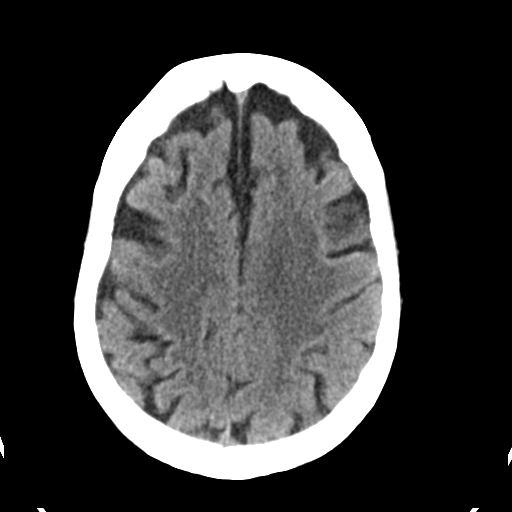
[im 23/32  brain]
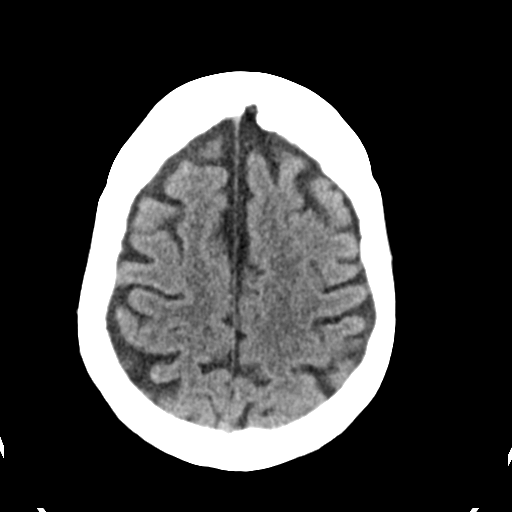
[im 24/32  brain]
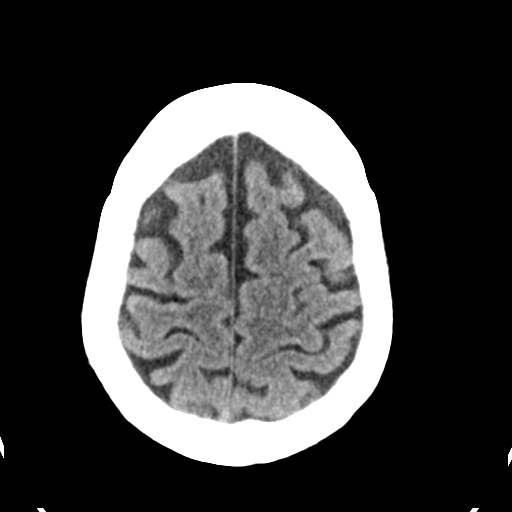
[im 24/32  bone]
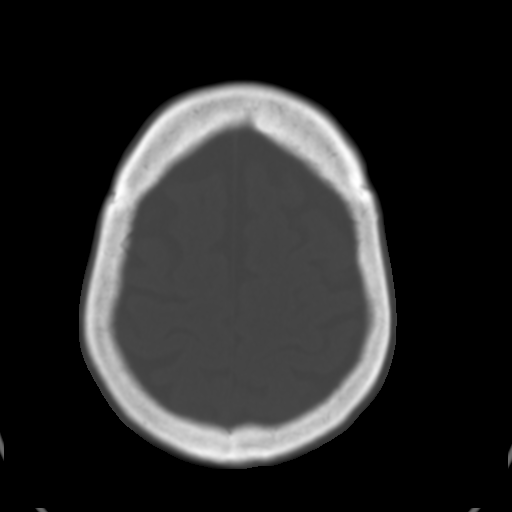
[im 26/32  brain]
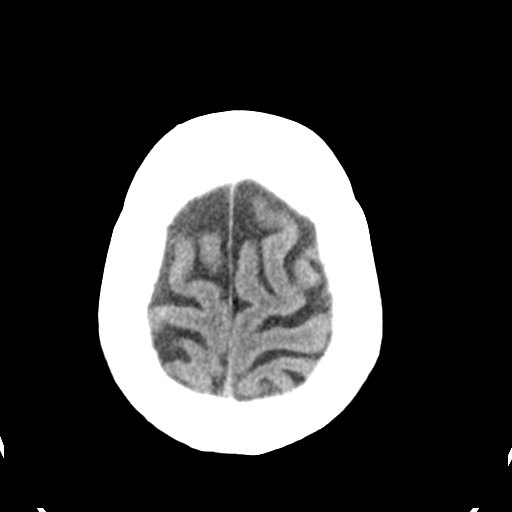
[im 28/32  brain]
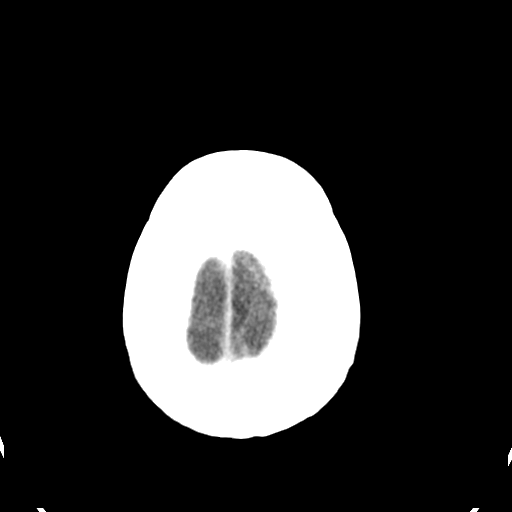
[im 30/32  brain]
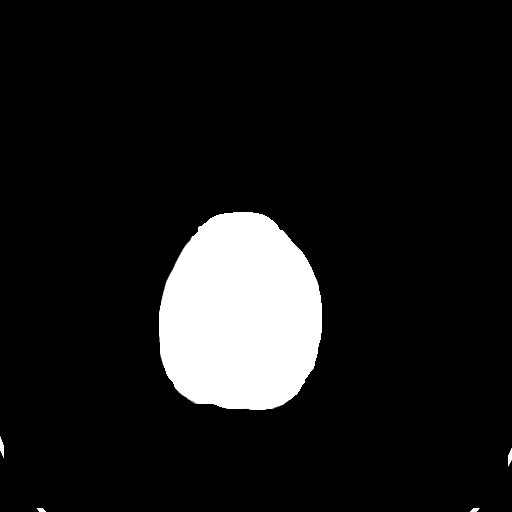

[16 of 30 positions shown; findings below may reference images not displayed]

FINDINGS: No mass. No hydrocephalus. No hemorrhage. Old lacune inferior aspect
of the left lenticular nucleus. Diffuse cerebral atrophy is present.
No acute bony abnormality.
IMPRESSION: Diffuse cerebral atrophy. Old lacunar infarct inferior aspect left
lenticular nucleus. No acute abnormality.

## 2014-09-06 MED ORDER — ENSURE COMPLETE PO LIQD
237.0000 mL | Freq: Two times a day (BID) | ORAL | Status: DC
  Administered 2014-09-06 – 2014-09-08 (×5): 237 mL via ORAL

## 2014-09-06 MED ORDER — POLYETHYLENE GLYCOL 3350 17 G PO PACK
17.0000 g | PACK | Freq: Every day | ORAL | Status: DC | PRN
  Filled 2014-09-06 (×2): qty 1

## 2014-09-06 MED ORDER — DILTIAZEM HCL 100 MG IV SOLR
5.0000 mg/h | INTRAVENOUS | Status: DC
  Administered 2014-09-06 – 2014-09-07 (×2): 5 mg/h via INTRAVENOUS
  Filled 2014-09-06 (×3): qty 100

## 2014-09-06 MED ORDER — ONDANSETRON HCL 4 MG/2ML IJ SOLN: 4.0000 mg | Freq: Three times a day (TID) | INTRAMUSCULAR | Status: AC | PRN

## 2014-09-06 MED ORDER — APIXABAN 2.5 MG PO TABS
2.5000 mg | ORAL_TABLET | Freq: Two times a day (BID) | ORAL | Status: DC
  Administered 2014-09-06 – 2014-09-08 (×6): 2.5 mg via ORAL
  Filled 2014-09-06 (×8): qty 1

## 2014-09-06 MED ORDER — DARIFENACIN HYDROBROMIDE ER 7.5 MG PO TB24
7.5000 mg | ORAL_TABLET | Freq: Every day | ORAL | Status: DC
Start: 2014-09-06 — End: 2014-09-07
  Administered 2014-09-06: 7.5 mg via ORAL
  Filled 2014-09-06 (×2): qty 1

## 2014-09-06 MED ORDER — ENOXAPARIN SODIUM 40 MG/0.4ML ~~LOC~~ SOLN
40.0000 mg | SUBCUTANEOUS | Status: DC
  Filled 2014-09-06 (×2): qty 0.4

## 2014-09-06 MED ORDER — TIMOLOL MALEATE 0.5 % OP SOLN
1.0000 [drp] | Freq: Two times a day (BID) | OPHTHALMIC | Status: DC
  Administered 2014-09-06 – 2014-09-08 (×5): 1 [drp] via OPHTHALMIC
  Filled 2014-09-06 (×4): qty 5

## 2014-09-06 MED ORDER — SIMVASTATIN 10 MG PO TABS
10.0000 mg | ORAL_TABLET | Freq: Every day | ORAL | Status: DC
  Administered 2014-09-06 – 2014-09-08 (×3): 10 mg via ORAL
  Filled 2014-09-06 (×4): qty 1

## 2014-09-06 MED ORDER — LEVETIRACETAM 250 MG PO TABS
250.0000 mg | ORAL_TABLET | Freq: Two times a day (BID) | ORAL | Status: DC
Start: 2014-09-06 — End: 2014-09-09
  Administered 2014-09-06 – 2014-09-08 (×6): 250 mg via ORAL
  Filled 2014-09-06 (×8): qty 1

## 2014-09-06 MED ORDER — ASPIRIN 325 MG PO TABS
325.0000 mg | ORAL_TABLET | Freq: Every day | ORAL | Status: DC
  Administered 2014-09-06: 325 mg via ORAL
  Filled 2014-09-06 (×2): qty 1

## 2014-09-06 MED ORDER — LATANOPROST 0.005 % OP SOLN
1.0000 [drp] | Freq: Every day | OPHTHALMIC | Status: DC
  Administered 2014-09-06 – 2014-09-08 (×3): 1 [drp] via OPHTHALMIC
  Filled 2014-09-06: qty 2.5

## 2014-09-06 MED ORDER — ALLOPURINOL 300 MG PO TABS
300.0000 mg | ORAL_TABLET | Freq: Every day | ORAL | Status: DC
  Administered 2014-09-06 – 2014-09-08 (×3): 300 mg via ORAL
  Filled 2014-09-06 (×4): qty 1

## 2014-09-06 MED ORDER — PANTOPRAZOLE SODIUM 40 MG PO TBEC
40.0000 mg | DELAYED_RELEASE_TABLET | Freq: Every day | ORAL | Status: DC
  Administered 2014-09-06 – 2014-09-08 (×3): 40 mg via ORAL
  Filled 2014-09-06 (×3): qty 1

## 2014-09-06 MED ORDER — STROKE: EARLY STAGES OF RECOVERY BOOK
Freq: Once | Status: DC
  Filled 2014-09-06: qty 1

## 2014-09-06 MED ORDER — SODIUM CHLORIDE 0.9 % IV SOLN
INTRAVENOUS | Status: DC
Start: 2014-09-06 — End: 2014-09-09
  Administered 2014-09-06: 75 mL via INTRAVENOUS
  Administered 2014-09-07: 22:00:00 via INTRAVENOUS
  Administered 2014-09-08: 50 mL via INTRAVENOUS

## 2014-09-06 MED ORDER — CIPROFLOXACIN HCL 500 MG PO TABS
500.0000 mg | ORAL_TABLET | Freq: Two times a day (BID) | ORAL | Status: DC
Start: 2014-09-06 — End: 2014-09-07
  Administered 2014-09-06 (×2): 500 mg via ORAL
  Filled 2014-09-06 (×5): qty 1

## 2014-09-06 NOTE — Progress Notes (Signed)
UR Completed [place]on Maxson Graves-Bigelow, RN,BSN [phone]  

## 2014-09-06 NOTE — Progress Notes (Signed)
Echocardiogram 2D Echocardiogram has been performed.  Danielle Roth 09/06/2014, 12:48 PM

## 2014-09-06 NOTE — Evaluation (Signed)
Physical Therapy Evaluation Patient Details Name: Danielle Roth MRN: [id number] DOB: Jun 20, 1918 Today's Date: 09/06/2014   History of Present Illness  80 y.o. female admitted to [facility] on 09/05/14 from [facility] independent living due to sudden onset weakness and inability to walk.  Stroke workup in progress.  CT negative and pt unable to have an MRI due to pacemaker.  She was noted to have some symptoms consistant with FTT and a possible recurrance of her UTI.  Pt with significant PMhx of HTN, PAF, stroke, gout, SSS, PE, partial seizure, MI, breast CA with left mastectomy and restricted L extremity.  Clinical Impression  Pt is generally weak and fearful of falling.  It is taking two people to assist her to stand and she has such a posterior push that it was not safe even to transfer to the [facility] for toileting and peri care.  At this level of assist, she is most appropriate for SNF level rehab.  Daughter is interested in having a palliative care goals of care meeting, but definitely wants PT to continue to follow.   PT to follow acutely for deficits listed below.       Follow Up Recommendations SNF    Equipment Recommendations  None recommended by PT    Recommendations for Other Services   NA    Precautions / Restrictions Precautions Precautions: Fall Precaution Comments: pt is very fearful of falling and had a significant posterior push on eval.       Mobility  Bed Mobility Overal bed mobility: Needs Assistance Bed Mobility: Supine to Sit;Sit to Supine     Supine to sit: +2 for physical assistance;Max assist Sit to supine: +2 for physical assistance;Max assist   General bed mobility comments: Two person max assist to get to sitting EOB.  Pt pushing posteriorly resisting the movement the entire transition.  She verbalized being fearful of falling.   Transfers Overall transfer level: Needs assistance Equipment used: None Transfers: Sit to/from Stand Sit to Stand: Max assist;+2  physical assistance         General transfer comment: Two person max assist to get to standing EOB.  Pt continues with maximal posterior lean and push making her more unsteady and at higher risk for falls due to extended trunk and extended knees.    Ambulation/Gait             General Gait Details: not tested             Balance Overall balance assessment: Needs assistance Sitting-balance support: Feet supported;Bilateral upper extremity supported Sitting balance-Leahy Scale: Zero Sitting balance - Comments: needs max assist to maintain sitting balance EOB due to pt pushing posteriorly.  Postural control: Posterior lean Standing balance support: Bilateral upper extremity supported Standing balance-Leahy Scale: Zero Standing balance comment: max assist needed to stand due to posterior pushing.                              Pertinent Vitals/Pain Pain Assessment: No/denies pain    Home Living Family/patient expects to be discharged to:: Assisted living (vs SNF)               Home Equipment: Walker - 4 wheels;Bedside commode Additional Comments: Per daughter's report she was recently d/c from SNF back to independent apartment and they were discussing moving her to ALF.  She was active with PT and had recently walked >150' with RW and the therapist.  Prior Function Level of Independence: Needs assistance   Gait / Transfers Assistance Needed: Uses rolllator for ambulation short distances. Able to walk half way to dining hall and then uses transport chair the rest the way per daughter.           Hand Dominance   Dominant Hand: Right    Extremity/Trunk Assessment   Upper Extremity Assessment: LUE deficits/detail       LUE Deficits / Details: left arm with (+) lymphedema due to PMHx of mastectomy on the left.    Lower Extremity Assessment: Generalized weakness (equally weak)      Cervical / Trunk Assessment: Kyphotic  Communication    Communication: HOH  Cognition Arousal/Alertness: Lethargic (very sleepy, per daughter has been sleeping most of the day) Behavior During Therapy: Anxious Overall Cognitive Status:  (not specifically tested)                               Assessment/Plan    PT Assessment Patient needs continued PT services  PT Diagnosis Difficulty walking;Abnormality of gait;Generalized weakness   PT Problem List Decreased strength;Decreased activity tolerance;Decreased mobility;Decreased balance;Decreased knowledge of use of DME  PT Treatment Interventions DME instruction;Gait training;Functional mobility training;Therapeutic exercise;Therapeutic activities;Balance training;Neuromuscular re-education;Patient/family education   PT Goals (Current goals can be found in the Care Plan section) Acute Rehab PT Goals Patient Stated Goal: daughter is realistic and thinks she may need to be a SNF resident at this point, but is still hopeful for transition to ALF.   PT Goal Formulation: With family Time For Goal Achievement: 09/20/14 Potential to Achieve Goals: Fair    Frequency Min 3X/week    End of Session Equipment Utilized During Treatment: Gait belt Activity Tolerance: Patient limited by fatigue Patient left: in bed;with call bell/phone within reach;with bed alarm set;with family/visitor present Nurse Communication: Mobility status         Time: 0165-5374 PT Time Calculation (min): 36 min   Charges:   PT Evaluation $Initial PT Evaluation Tier I: 1 Procedure PT Treatments $Therapeutic Activity: 8-22 mins        Danielle Roth B. Elida, [place], DPT [phone]   09/06/2014, 5:32 PM

## 2014-09-06 NOTE — Progress Notes (Addendum)
INITIAL NUTRITION ASSESSMENT  DOCUMENTATION CODES Per approved criteria  -Not Applicable   INTERVENTION:  Ensure Complete PO BID, each supplement provides 350 kcal and 13 grams of protein  Daughter to bring in food from home (fresh fruit) per patient preference.  NUTRITION DIAGNOSIS: Inadequate oral intake related to poor appetite as evidenced by minimal intake of meals since admission.   Goal: Intake to meet >90% of estimated nutrition needs.  Monitor:  PO intake, labs, weight trend.  Reason for Assessment: MST  80 y.o. female  Admitting Dx: Stroke  ASSESSMENT: 80 yo female admitted from home with fatigue and AMS related to stroke. Lives at home with daughter.  Patient's daughter reports that patient has been eating poorly for the past few days. She was recently in rehab for 6 weeks, just came home 1 week ago. She started liking chocolate Ensure supplements while at nursing facility for rehab. She had lost some weight, but weight has now stabilized. Daughter requested to bring in fresh fruit for patient because she seems to do well with that.  Nutrition Focused Physical Exam:  Subcutaneous Fat:  Orbital Region: mild depletion Upper Arm Region: WNL Thoracic and Lumbar Region: NA  Muscle:  Temple Region: mild depletion Clavicle Bone Region: WNL Clavicle and Acromion Bone Region: WNL Scapular Bone Region: NA Dorsal Hand: WNL Patellar Region: WNL Anterior Thigh Region: WNL Posterior Calf Region: WNL  Edema: None  Height: Ht Readings from Last 1 Encounters:  09/06/14 5\' 6"  (1.676 m)    Weight: Wt Readings from Last 1 Encounters:  09/06/14 136 lb 9.6 oz (61.961 kg)    Ideal Body Weight: 59.1 kg  % Ideal Body Weight: 105%  Wt Readings from Last 10 Encounters:  09/06/14 136 lb 9.6 oz (61.961 kg)  07/16/14 134 lb 6.4 oz (60.963 kg)  12/24/13 149 lb (67.586 kg)  10/02/13 149 lb (67.586 kg)  02/05/13 164 lb (74.39 kg)  02/03/12 177 lb 12.8 oz (80.65 kg)   06/18/11 186 lb (84.369 kg)  11/06/10 183 lb (83.008 kg)  10/28/09 175 lb 6.1 oz (79.552 kg)    Usual Body Weight: 134 lb 2 months ago  % Usual Body Weight: 101%  BMI:  Body mass index is 22.06 kg/(m^2).  Estimated Nutritional Needs: Kcal: 1550-1750 Protein: 80-90 gm Fluid: 1.6-1.8 L  Skin: WDL  Diet Order: Cardiac  EDUCATION NEEDS: -No education needs identified at this time  No intake or output data in the 24 hours ending 09/06/14 1456  Last BM: None documented since admission    Labs:   Recent Labs Lab 09/05/14 1802  NA 136*  K 4.3  CL 97  CO2 26  BUN 19  CREATININE 0.84  CALCIUM 10.2  GLUCOSE 171*    CBG (last 3)   Recent Labs  09/06/14 0737 09/06/14 1145  GLUCAP 102* 118*    Scheduled Meds: .  stroke: mapping our early stages of recovery book   Does not apply Once  . allopurinol  300 mg Oral QHS  . apixaban  2.5 mg Oral BID  . aspirin  325 mg Oral Daily  . ciprofloxacin  500 mg Oral BID  . darifenacin  7.5 mg Oral Daily  . latanoprost  1 drop Both Eyes QHS  . levETIRAcetam  250 mg Oral Q12H  . pantoprazole  40 mg Oral Daily  . simvastatin  10 mg Oral q1800  . timolol  1 drop Both Eyes BID    Continuous Infusions:   Past Medical History  Diagnosis Date  . Glaucoma   . Hyperlipidemia   . Hypertension   . Stroke   . Paroxysmal atrial fibrillation     on coumadin  . Gout   . SSS (sick sinus syndrome) 2007    PPM 2007 by Dr. Verlon Setting  . Pulmonary embolus     on coumadin  . Partial seizures   . Cerebrovascular disease   . Myocardial infarction   . Degenerative arthritis   . GERD (gastroesophageal reflux disease)   . Helicobacter pylori (H. pylori) 2002  . Breast cancer     left mastectomy    Past Surgical History  Procedure Laterality Date  . Mastectomy Left   . Tonsillectomy    . Pacemaker insertion  2007    dual-chamber permanent pacemaker implantation by [name] MDT    Molli Barrows, [place], LDN, [place] Pager  [phone] After Hours Pager [phone]

## 2014-09-06 NOTE — Care Management Note (Unsigned)
    Page 1 of 1   09/06/2014     2:39:28 PM CARE MANAGEMENT NOTE 09/06/2014  Patient:  Danielle Roth, Danielle Roth   Account Number:  [id number]  Date Initiated:  09/06/2014  Documentation initiated by:  GRAVES-BIGELOW,Antwoin Lackey  Subjective/Objective Assessment:   Pt admitted for Weakness. Pt has family support of daughter. Pt is from IDL @ [facility] [place]. Pt may need higher level of care.     Action/Plan:   CM did speak to daughter and daughter requesting consult for palliative care for [place] meeting. CM did ask RN to get order from MD. CM will continue to monitor.   Anticipated DC Date:  09/09/2014   Anticipated DC Plan:  [place]  CM consult      Choice offered to / List presented to:             Status of service:   Medicare Important Message given?  YES (If response is "NO", the following Medicare IM given date fields will be blank) Date Medicare IM given:  09/06/2014 Medicare IM given by:  GRAVES-BIGELOW,Canaan Holzer Date Additional Medicare IM given:   Additional Medicare IM given by:    Discharge Disposition:    Per UR Regulation:  Reviewed for med. necessity/level of care/duration of stay  If discussed at [place] of Stay Meetings, dates discussed:    Comments:

## 2014-09-06 NOTE — H&P (Signed)
PCP:   Sheela Stack, MD   Chief Complaint:  Weakness  HPI: This is a 80 year old white female is been a long-standing patient of practice. She's been living at [facility] recently was in there to skilled care after UTI and weakness. She was last hospitalized August 1 with a UTI. Several calls to the office within the last couple weeks about her general status. Now she comes in during the night with weakness and unable to stand. There is a thought that she might of had a right sided TIA or stroke. Until recently she had chronically been on Coumadin. Now she is on Elliquis is and aspirin. Workup in the emergency room revealed a nonischemic EKG and negative troponin. A CT of the head was unremarkable. An MRI was ordered but this is been canceled due to the fact that she has a pacemaker in place. At the moment she is alone in her hospital room. She is sitting up with a breakfast tray this approximately 10% consumed. She will awaken and answer a few my questions but is fairly somnolent. No other data is available the moment  Review of Systems:  Review of Systems - she denies pain. No other complaints are noted Past Medical History: Past Medical History  Diagnosis Date  . Glaucoma   . Hyperlipidemia   . Hypertension   . Stroke   . Paroxysmal atrial fibrillation     on coumadin  . Gout   . SSS (sick sinus syndrome) 2007    PPM 2007 by Dr. Verlon Setting  . Pulmonary embolus       . Partial seizures   . Cerebrovascular disease   . Myocardial infarction   . Degenerative arthritis   . GERD (gastroesophageal reflux disease)   . Helicobacter pylori (H. pylori) 2002  . Breast cancer     left mastectomy   Past Surgical History  Procedure Laterality Date  . Mastectomy Left   . Tonsillectomy    . Pacemaker insertion  2007    dual-chamber permanent pacemaker implantation by [name] MDT    Medications: Prior to Admission medications   Medication Sig Start Date End Date Taking? Authorizing  Provider  allopurinol (ZYLOPRIM) 300 MG tablet Take 300 mg by mouth at bedtime.     Historical Provider, MD  apixaban (ELIQUIS) 2.5 MG TABS tablet Take 2.5 mg by mouth 2 (two) times daily.    Historical Provider, MD  bimatoprost (LUMIGAN) 0.01 % SOLN Place 1 drop into both eyes at bedtime.    Historical Provider, MD  Calcium Carbonate-Vit D-Min (CALTRATE 600+D PLUS MINERALS PO) Take 1 tablet by mouth daily.    Historical Provider, MD  cefUROXime (CEFTIN) 250 MG tablet Take 1 tab by mouth BID with final dose on evening of 8/3 07/20/14   Velna Hatchet, MD  hydrochlorothiazide (MICROZIDE) 12.5 MG capsule Take 12.5 mg by mouth every Wednesday and Saturday.     Historical Provider, MD  levETIRAcetam (KEPPRA) 250 MG tablet Take 1 tablet (250 mg total) by mouth every 12 (twelve) hours. 12/24/13   Philmore Pali, NP  Multiple Vitamin (MULTIVITAMIN) tablet Take 1 tablet by mouth daily.     Historical Provider, MD  omeprazole (PRILOSEC) 20 MG capsule Take 20 mg by mouth daily.     Historical Provider, MD  polyethylene glycol (MIRALAX / GLYCOLAX) packet Take 17 g by mouth daily as needed (for regularity).    Historical Provider, MD  pravastatin (PRAVACHOL) 40 MG tablet Take 40 mg by mouth daily.  Historical Provider, MD  timolol (TIMOPTIC) 0.5 % ophthalmic solution Place 1 drop into both eyes 2 (two) times daily.    Historical Provider, MD  trospium (SANCTURA) 20 MG tablet Take 20 mg by mouth 2 (two) times daily.    Historical Provider, MD    Allergies:   Allergies  Allergen Reactions  . Sulfonamide Derivatives Anaphylaxis  . Other Nausea And Vomiting    Walnuts  . Codeine Nausea And Vomiting and Rash  . Diclofenac Sodium Rash    The gel  . Iodine Rash    topical    Social History:  reports that she has never smoked. She has never used smokeless tobacco. She reports that she does not drink alcohol or use illicit drugs.  Family History: Family History  Problem Relation Age of Onset  . Heart  disease Mother   . Kidney disease Mother   . Heart disease Father   . Colon cancer Neg Hx     Physical Exam: Filed Vitals:   09/06/14 0114 09/06/14 0115 09/06/14 0205 09/06/14 0400  BP: 106/46  127/50 103/44  Pulse: 75  63 67  Temp: 97.6 F (36.4 C) 97.9 F (36.6 C) 98.7 F (37.1 C) 97.5 F (36.4 C)  TempSrc: Oral  Oral Oral  Resp: 18  18 16   Height:   5\' 6"  (1.676 m)   Weight:   61.961 kg (136 lb 9.6 oz)   SpO2: 95%  95% 98%   General appearance: Frail elderly white female sitting up. Bitemporal wasting is present. Sclera anicteric. Eyes are conjugate and there is no nystagmus. Head: Normocephalic, without obvious abnormality, atraumatic Oral mucous membranes are moist. Neck: no adenopathy, I cannot hear a bruit. Resp: Clear with no wheezes, rales, or rhonchi. No accessory muscles are in use. Cardio: Regular with rare skips periods systolic murmur is present. GI: soft, non-tender; bowel sounds normal; no masses,  no organomegaly Extremities: 1+ edema with slightly reduced pulses Lymph nodes: Cervical adenopathy: no cervical lymphadenopathy Neurologic: She is somnolent but will awaken. She will flutter eyes open briefly. Her grip is equal at the moment. No resting tremor is present. Speech is low volume in single words. Face appears symmetric.  Labs on Admission:   Recent Labs  09/05/14 1802  NA 136*  K 4.3  CL 97  CO2 26  GLUCOSE 171*  BUN 19  CREATININE 0.84  CALCIUM 10.2    Recent Labs  09/05/14 1802  AST 101*  ALT 18  ALKPHOS 196*  BILITOT 0.7  PROT 6.5  ALBUMIN 2.5*     Recent Labs  09/05/14 1802  WBC 7.1  HGB 10.7*  HCT 32.6*  MCV 91.8  PLT 140*    Recent Labs  09/05/14 1802  TROPONINI <0.30   Radiological Exams on Admission: Ct Head Wo Contrast  09/05/2014   CLINICAL DATA:  80 year old female with altered mental status and confusion.  EXAM: CT HEAD WITHOUT CONTRAST  TECHNIQUE: Contiguous axial images were obtained from the base of  the skull through the vertex without intravenous contrast.  COMPARISON:  07/16/2014  FINDINGS: Atrophy and chronic small-vessel white matter ischemic changes again noted.  No acute intracranial abnormalities are identified, including mass lesion or mass effect, hydrocephalus, extra-axial fluid collection, midline shift, hemorrhage, or acute infarction.  The visualized bony calvarium is unremarkable.  IMPRESSION: No evidence of acute intracranial abnormality.  Atrophy and chronic small-vessel white matter ischemic changes.   Electronically Signed   By: Coral Spikes.D.  On: 09/05/2014 22:41   Orders placed during the hospital encounter of 09/05/14  . ED EKG  . ED EKG  . ED EKG  . ED EKG  . EKG 12-LEAD  . EKG 12-LEAD  . EKG    Assessment/Plan Principal Problem:   Stroke vs TIA. At the moment she has more global diminished level of consciousness rather than focal findings. She's artery on maximal therapy with lupus and aspirin. She's previously been on Coumadin with a difficult time keeping her INR in the therapeutic range. She's not really a candidate for intervention of her carotids. She can't have an MRI to look further at her brain. Supportive care would seem to be the best course of action. It is important to note there is no intracranial bleeding noted Active Problems:   Essential hypertension, benign: Blood pressures relatively well-controlled. We need to avoid hypotension   Atrial fibrillation: She is in sinus rhythm at the moment   DYSPHAGIA: She well speech evaluation   Localization-related (focal) (partial) epilepsy and epileptic syndromes with complex partial seizures, without mention of intractable epilepsy: She is on Keppra. I don't think this is a seizure event UTI: The urine looks like there may be an early infection that might Be clouding her sensorium. Ciprofloxacin will be added Protein calorie malnutrition this is moderately severe and progressive Unsteady gait: She is at risk  for falls Prior pulmonary embolism on long-term and coagulation Adult failure to thrive>: This appears to be age related. She does have some probable early dementia. Gout: She is on prophylaxis Coronary artery disease: EKG is nonischemic and troponin the spine Distant history of breast cancer: No evidence of metastases DO NOT RESUSCITATE status   Kiowa Peifer ALAN 09/06/2014, 10:35 AM

## 2014-09-07 DIAGNOSIS — E46 Unspecified protein-calorie malnutrition: Secondary | ICD-10-CM | POA: Diagnosis present

## 2014-09-07 DIAGNOSIS — I6350 Cerebral infarction due to unspecified occlusion or stenosis of unspecified cerebral artery: Secondary | ICD-10-CM

## 2014-09-07 DIAGNOSIS — I4891 Unspecified atrial fibrillation: Secondary | ICD-10-CM | POA: Diagnosis present

## 2014-09-07 DIAGNOSIS — Z950 Presence of cardiac pacemaker: Secondary | ICD-10-CM | POA: Diagnosis present

## 2014-09-07 DIAGNOSIS — R531 Weakness: Secondary | ICD-10-CM | POA: Diagnosis present

## 2014-09-07 LAB — LIPID PANEL
CHOL/HDL RATIO: 3.9 ratio
CHOLESTEROL: 110 mg/dL (ref 0–200)
HDL: 28 mg/dL — ABNORMAL LOW (ref >39)
LDL Cholesterol: 64 mg/dL (ref 0–99)
TRIGLYCERIDES: 91 mg/dL (ref <150)
VLDL: 18 mg/dL (ref 0–40)

## 2014-09-07 LAB — GLUCOSE, CAPILLARY
GLUCOSE-CAPILLARY: 122 mg/dL — AB (ref 70–99)
Glucose-Capillary: 115 mg/dL — ABNORMAL HIGH (ref 70–99)
Glucose-Capillary: 136 mg/dL — ABNORMAL HIGH (ref 70–99)
Glucose-Capillary: 181 mg/dL — ABNORMAL HIGH (ref 70–99)
Glucose-Capillary: 187 mg/dL — ABNORMAL HIGH (ref 70–99)

## 2014-09-07 MED ORDER — CIPROFLOXACIN HCL 250 MG PO TABS
250.0000 mg | ORAL_TABLET | Freq: Two times a day (BID) | ORAL | Status: DC
  Filled 2014-09-07 (×2): qty 1

## 2014-09-07 MED ORDER — ASPIRIN EC 81 MG PO TBEC
81.0000 mg | DELAYED_RELEASE_TABLET | Freq: Every day | ORAL | Status: DC
  Administered 2014-09-07 – 2014-09-08 (×2): 81 mg via ORAL
  Filled 2014-09-07 (×4): qty 1

## 2014-09-07 MED ORDER — CIPROFLOXACIN HCL 250 MG PO TABS
250.0000 mg | ORAL_TABLET | Freq: Two times a day (BID) | ORAL | Status: DC
  Administered 2014-09-07 – 2014-09-08 (×4): 250 mg via ORAL
  Filled 2014-09-07 (×7): qty 1

## 2014-09-07 NOTE — Progress Notes (Signed)
VASCULAR LAB PRELIMINARY  PRELIMINARY  PRELIMINARY  PRELIMINARY  Carotid Dopplers completed.    Preliminary report:  1-39% ICA stenosis.  Vertebral artery flow is antegrade.   Royale Lennartz, RVT 09/07/2014, 11:43 AM

## 2014-09-07 NOTE — Progress Notes (Signed)
Subjective: I spent >30 minutes reviewing history and events with patient and her daughter.  Within 3 days of discharge from [facility] she developed increased weakness which progressed over the next 3 days to the point she was unable to stand due to generalized weakness.  No focal deficits.  Daughter reports recurrent UTIs, progressive functional decline over the past year with repeated hospitalizations.  Poor po intake (drinks 3 ensures per day).  Has been resistant to moving from her independent apartment at [place] but recognizes that she needs additional care.  She developed Afib with RVR last evening-->started on Cardizem drip and IV fluids with improvement.    Objective: Vital signs in last 24 hours: Temp:  [97.5 F (36.4 C)-98.9 F (37.2 C)] 97.7 F (36.5 C) (09/19 0752) Pulse Rate:  [60-119] 88 (09/19 0752) Resp:  [16-19] 19 (09/19 0752) BP: (100-116)/(38-69) 108/49 mmHg (09/19 0752) SpO2:  [95 %-98 %] 95 % (09/19 0752) Weight change:     CBG (last 3)   Recent Labs  09/06/14 2049 09/07/14 0602 09/07/14 0754  GLUCAP 136* 115* 122*    Intake/Output from previous day:   Intake/Output this shift:    General appearance: hard of hearing; awakens and answers questions appropriately but apathetic Eyes: no scleral icterus Throat: oropharynx moist without erythema Resp: clear to auscultation bilaterally Cardio: irregularly irregular rhythm GI: soft, non-tender; bowel sounds normal; no masses,  no organomegaly Extremities: no clubbing, cyanosis or edema Neurologic: appropriately answers questions; generalized weakness but no focal weakness or assymetry   Lab Results:  Recent Labs  09/05/14 1802  NA 136*  K 4.3  CL 97  CO2 26  GLUCOSE 171*  BUN 19  CREATININE 0.84  CALCIUM 10.2    Recent Labs  09/05/14 1802  AST 101*  ALT 18  ALKPHOS 196*  BILITOT 0.7  PROT 6.5  ALBUMIN 2.5*    Recent Labs  09/05/14 1802  WBC 7.1  HGB 10.7*  HCT 32.6*  MCV  91.8  PLT 140*   Lab Results  Component Value Date   INR 1.88* 07/17/2014   INR 1.70* 07/16/2014   INR 2.14* 11/01/2013    Recent Labs  09/05/14 1802  TROPONINI <0.30   No results found for this basename: TSH, T4TOTAL, FREET3, T3FREE, THYROIDAB,  in the last 72 hours No results found for this basename: VITAMINB12, FOLATE, FERRITIN, TIBC, IRON, RETICCTPCT,  in the last 72 hours  Studies/Results: Ct Head Wo Contrast  09/05/2014   CLINICAL DATA:  80 year old female with altered mental status and confusion.  EXAM: CT HEAD WITHOUT CONTRAST  TECHNIQUE: Contiguous axial images were obtained from the base of the skull through the vertex without intravenous contrast.  COMPARISON:  07/16/2014  FINDINGS: Atrophy and chronic small-vessel white matter ischemic changes again noted.  No acute intracranial abnormalities are identified, including mass lesion or mass effect, hydrocephalus, extra-axial fluid collection, midline shift, hemorrhage, or acute infarction.  The visualized bony calvarium is unremarkable.  IMPRESSION: No evidence of acute intracranial abnormality.  Atrophy and chronic small-vessel white matter ischemic changes.   Electronically Signed   By: Hassan Rowan M.D.   On: 09/05/2014 22:41     Medications: Scheduled: .  stroke: mapping our early stages of recovery book   Does not apply Once  . allopurinol  300 mg Oral QHS  . apixaban  2.5 mg Oral BID  . aspirin  325 mg Oral Daily  . ciprofloxacin  500 mg Oral BID  . darifenacin  7.5  mg Oral Daily  . feeding supplement (ENSURE COMPLETE)  237 mL Oral BID BM  . latanoprost  1 drop Both Eyes QHS  . levETIRAcetam  250 mg Oral Q12H  . pantoprazole  40 mg Oral Daily  . simvastatin  10 mg Oral q1800  . timolol  1 drop Both Eyes BID   Continuous: . sodium chloride 75 mL (09/06/14 1903)  . diltiazem (CARDIZEM) infusion 5 mg/hr (09/06/14 1904)    Assessment/Plan: Principal Problem: 1. Generalized weakness- her history and exam are  consistent with subacute generalized weakness and functional decline rather than TIA/CVA.  Multifactorial secondary to declining functional status/FTT, Afib with RVR, medications (Trospium, HCTZ), dehydration.  Will discontinue Trospium and HCTZ and gently hydrate.  Continue PT.  OOB to chair.  Anticipate need to return to SNF and possible transition to ALF or LTC in the future.  Active Problems: 2. Atrial fibrillation with RVR/Cardiac pacemaker in situ- RVR improved with Cardizem drip.  Will monitor for 24 hours and transition to oral option if BP stable.  3. Essential hypertension, benign- BP has been labile per daughter's report.  Monitor on Cardizem drip.  Discontinue HCTZ. 4. Localization-related (focal) (partial) epilepsy and epileptic syndromes with complex partial seizures, without mention of intractable epilepsy- continue Keppra 5. Unspecified protein-calorie malnutrition- continue Ensure supplements. 6. Ethics- we discussed goals of care and anticipated continued decline in functional status.  Discussed ways to prevent readmission.  Introduced MOST form. 7. Disposition- anticipated return to [facility] at [facility] for rehab in 2-3 days.     LOS: 2 days   Marton Redwood 09/07/2014, 8:28 AM

## 2014-09-07 NOTE — Clinical Social Work Placement (Addendum)
Clinical Social Work Department CLINICAL SOCIAL WORK PLACEMENT NOTE 09/07/2014  Patient:  JAMESON, MORROW  Account Number:  [id number] Admit date:  09/05/2014  Clinical Social Worker:  Carrington Clamp, LCSWA  Date/time:  09/07/2014 04:34 PM  Clinical Social Work is seeking post-discharge placement for this patient at the following level of care:   [place]   (*CSW will update this form in Epic as items are completed)   09/07/2014  Patient/family provided with [place] Department of Clinical Social Work's list of facilities offering this level of care within the geographic area requested by the patient (or if unable, by the patient's family).  09/07/2014  Patient/family informed of their freedom to choose among providers that offer the needed level of care, that participate in Medicare, Medicaid or managed care program needed by the patient, have an available bed and are willing to accept the patient.  09/07/2014  Patient/family informed of [facility]' ownership interest in [facility], as well as of the fact that they are under no obligation to receive care at this facility.  PASARR submitted to EDS on Existing PASARR number received on Existing  FL2 transmitted to all facilities in geographic area requested by pt/family on  09/07/2014 FL2 transmitted to all facilities within larger geographic area on   Patient informed that his/her managed care company has contracts with or will negotiate with  certain facilities, including the following:     Patient/family informed of bed offers received:  09/09/2014 Patient chooses bed at [facility] Physician recommends and patient chooses bed at    Patient to be transferred to [facility] on  09/09/2014 Patient to be transferred to facility by [facility] Patient and family notified of transfer on 09/09/2014 Name of family member notified:  Langston Reusing (daughters)  The following physician request were entered in  Epic:   Additional Comments:  Chiropodist, [place] Weekend Clinical Social Worker [phone]

## 2014-09-07 NOTE — Clinical Social Work Psychosocial (Addendum)
Clinical Social Work Department BRIEF PSYCHOSOCIAL ASSESSMENT 09/07/2014  Patient:  Danielle Roth, Danielle Roth     Account Number:  [id number]     Admit date:  09/05/2014  Clinical Social Worker:  Hubert Azure  Date/Time:  09/07/2014 04:35 PM  Referred by:  Physician  Date Referred:  09/07/2014 Referred for  SNF Placement   Other Referral:   Interview type:  Family Other interview type:   Eveline Keto (daughter)    PSYCHOSOCIAL DATA Living Status:  ALONE Admitted from facility:   Level of care:  Independent Living Primary support name:  Eveline Keto [phone]) Primary support relationship to patient:  CHILD, ADULT Degree of support available:   Good    CURRENT CONCERNS Current Concerns  Post-Acute Placement   Other Concerns:    SOCIAL WORK ASSESSMENT / PLAN CSW spoke with patient's daughter Raquel Sarna) via telephone as CSW was made aware Raquel Sarna makes decisions for patient. CSW introduced self and explained role. CSW discussed d/c plan with Raquel Sarna. Per Raquel Sarna, patient was living independently until her UTI in July. Raquel Sarna reports patient went to [facility]) for 6 weeks and returned to independent living. Raquel Sarna stated by the previous week, patient was weak and falling down. Raquel Sarna states she would love for patient to return home with an RN aide, but it would cost about $400 a day for 24 hour care. Per Raquel Sarna, she resides in [place] and has to work and her sister resides in [place] is partially retired. Raquel Sarna is agreeable to SNF placement for patient at [facility]. CSW discussed having an another facility in the event [facility] is not available. Per Raquel Sarna, she will "take my chances."  Per daughter, she would prefer to transport patient to SNF.   Assessment/plan status:  Other - See comment Other assessment/ plan:   CSW to update FL2 for SNF placement.   Information/referral to community resources:    PATIENT'S/FAMILY'S RESPONSE TO PLAN OF  CARE: Patient's daughter was pleasant and optimistic about [place] availability.   [place], [place] Weekend Clinical Social Worker [phone]

## 2014-09-08 LAB — GLUCOSE, CAPILLARY
GLUCOSE-CAPILLARY: 107 mg/dL — AB (ref 70–99)
GLUCOSE-CAPILLARY: 108 mg/dL — AB (ref 70–99)
GLUCOSE-CAPILLARY: 160 mg/dL — AB (ref 70–99)
Glucose-Capillary: 110 mg/dL — ABNORMAL HIGH (ref 70–99)
Glucose-Capillary: 137 mg/dL — ABNORMAL HIGH (ref 70–99)
Glucose-Capillary: 198 mg/dL — ABNORMAL HIGH (ref 70–99)

## 2014-09-08 LAB — CBC
HCT: 31.2 % — ABNORMAL LOW (ref 36.0–46.0)
Hemoglobin: 10.3 g/dL — ABNORMAL LOW (ref 12.0–15.0)
MCH: 30.7 pg (ref 26.0–34.0)
MCHC: 33 g/dL (ref 30.0–36.0)
MCV: 92.9 fL (ref 78.0–100.0)
PLATELETS: 181 10*3/uL (ref 150–400)
RBC: 3.36 MIL/uL — AB (ref 3.87–5.11)
RDW: 15.9 % — ABNORMAL HIGH (ref 11.5–15.5)
WBC: 5.8 10*3/uL (ref 4.0–10.5)

## 2014-09-08 LAB — BASIC METABOLIC PANEL
ANION GAP: 12 (ref 5–15)
BUN: 20 mg/dL (ref 6–23)
CALCIUM: 9.8 mg/dL (ref 8.4–10.5)
CHLORIDE: 101 meq/L (ref 96–112)
CO2: 23 meq/L (ref 19–32)
Creatinine, Ser: 0.77 mg/dL (ref 0.50–1.10)
GFR calc Af Amer: 81 mL/min — ABNORMAL LOW (ref >90)
GFR calc non Af Amer: 70 mL/min — ABNORMAL LOW (ref >90)
GLUCOSE: 110 mg/dL — AB (ref 70–99)
Potassium: 4.6 mEq/L (ref 3.7–5.3)
Sodium: 136 mEq/L — ABNORMAL LOW (ref 137–147)

## 2014-09-08 MED ORDER — DILTIAZEM HCL ER COATED BEADS 120 MG PO CP24
120.0000 mg | ORAL_CAPSULE | Freq: Every day | ORAL | Status: DC
  Administered 2014-09-08: 120 mg via ORAL
  Filled 2014-09-08 (×2): qty 1

## 2014-09-08 NOTE — Progress Notes (Signed)
Subjective: She looks and feels much better.  More energetic.  Feels stronger.  No complaints.  Denies shortness of breath.    Objective: Vital signs in last 24 hours: Temp:  [97.5 F (36.4 C)-99.1 F (37.3 C)] 99.1 F (37.3 C) (09/20 0820) Pulse Rate:  [48-95] 89 (09/20 0820) Resp:  [16-19] 19 (09/20 0820) BP: (110-145)/(46-58) 145/49 mmHg (09/20 0820) SpO2:  [93 %-97 %] 96 % (09/20 0820) Weight change:  Last BM Date: 09/03/14  CBG (last 3)   Recent Labs  09/08/14 0029 09/08/14 0438 09/08/14 0740  GLUCAP 137* 110* 107*    Intake/Output from previous day: 09/19 0701 - 09/20 0700 In: 960 [I.V.:960] Out: -  Intake/Output this shift:    General appearance: alert and oriented with significant improvement in level of alertness Eyes: no scleral icterus Throat: oropharynx moist without erythema Resp: clear to auscultation bilaterally Cardio: irregularly irregular rhythm GI: soft, non-tender; bowel sounds normal; no masses,  no organomegaly Extremities: no clubbing, cyanosis; trace edema   Lab Results:  Recent Labs  09/05/14 1802 09/08/14 0330  NA 136* 136*  K 4.3 4.6  CL 97 101  CO2 26 23  GLUCOSE 171* 110*  BUN 19 20  CREATININE 0.84 0.77  CALCIUM 10.2 9.8    Recent Labs  09/05/14 1802  AST 101*  ALT 18  ALKPHOS 196*  BILITOT 0.7  PROT 6.5  ALBUMIN 2.5*    Recent Labs  09/05/14 1802 09/08/14 0330  WBC 7.1 5.8  HGB 10.7* 10.3*  HCT 32.6* 31.2*  MCV 91.8 92.9  PLT 140* 181   Lab Results  Component Value Date   INR 1.88* 07/17/2014   INR 1.70* 07/16/2014   INR 2.14* 11/01/2013    Recent Labs  09/05/14 1802  TROPONINI <0.30   No results found for this basename: TSH, T4TOTAL, FREET3, T3FREE, THYROIDAB,  in the last 72 hours No results found for this basename: VITAMINB12, FOLATE, FERRITIN, TIBC, IRON, RETICCTPCT,  in the last 72 hours  Studies/Results: Carotid Dopplers: Preliminary report: 1-39% ICA stenosis. Vertebral artery flow is  antegrade.    Medications: Scheduled: .  stroke: mapping our early stages of recovery book   Does not apply Once  . allopurinol  300 mg Oral QHS  . apixaban  2.5 mg Oral BID  . aspirin EC  81 mg Oral Daily  . ciprofloxacin  250 mg Oral BID  . feeding supplement (ENSURE COMPLETE)  237 mL Oral BID BM  . latanoprost  1 drop Both Eyes QHS  . levETIRAcetam  250 mg Oral Q12H  . pantoprazole  40 mg Oral Daily  . simvastatin  10 mg Oral q1800  . timolol  1 drop Both Eyes BID   Continuous: . sodium chloride 75 mL/hr at 09/07/14 2219  . diltiazem (CARDIZEM) infusion 5 mg/hr (09/07/14 1032)    Assessment/Plan: Principal Problem:  1. Generalized weakness- her history and exam are consistent with subacute generalized weakness and functional decline rather than TIA/CVA. Multifactorial secondary to declining functional status/FTT, Afib with RVR, medications (Trospium, HCTZ), dehydration. Significant improvement over past 24 hours with hydration, rate control and medication adjustments (Trospium, HCTZ discontinued).  Continue PT/OT. Active Problems:  2. Atrial fibrillation with RVR/Cardiac pacemaker in situ- RVR improved with Cardizem drip. Will transition to oral Cardizem.  Continue Eliquis. 3. Essential hypertension, benign- BP has been labile per daughter's report. Stable on Cardizem drip- change to po. Discontinue HCTZ.  4. Localization-related (focal) (partial) epilepsy and epileptic syndromes with complex partial  seizures, without mention of intractable epilepsy- continue Keppra  5. Unspecified protein-calorie malnutrition- continue Ensure supplements.  6. Ethics- we discussed goals of care and anticipated continued decline in functional status. Discussed ways to prevent readmission. Introduced MOST form.  7. Disposition- anticipated return to [facility] at [facility] for rehab tomorrow.   LOS: 3 days   Danielle Roth 09/08/2014, 8:43 AM

## 2014-09-08 NOTE — Clinical Social Work Note (Signed)
CSW continues to follow this patient for d/c planning needs. CSW left bed offers with patient's sitter for daughter to review: [facility], [facility] [facility]. CSW to follow tomorrow.  [place], [place] Weekend Clinical Social Worker [phone]

## 2014-09-08 NOTE — Discharge Instructions (Signed)

## 2014-09-09 ENCOUNTER — Encounter (HOSPITAL_COMMUNITY): Payer: Self-pay | Admitting: General Practice

## 2014-09-09 LAB — GLUCOSE, CAPILLARY
Glucose-Capillary: 107 mg/dL — ABNORMAL HIGH (ref 70–99)
Glucose-Capillary: 113 mg/dL — ABNORMAL HIGH (ref 70–99)
Glucose-Capillary: 117 mg/dL — ABNORMAL HIGH (ref 70–99)
Glucose-Capillary: 146 mg/dL — ABNORMAL HIGH (ref 70–99)

## 2014-09-09 MED ORDER — DILTIAZEM HCL ER COATED BEADS 120 MG PO CP24: 120.0000 mg | ORAL_CAPSULE | Freq: Every day | ORAL | Status: AC

## 2014-09-09 MED ORDER — CETYLPYRIDINIUM CHLORIDE 0.05 % MT LIQD: 7.0000 mL | Freq: Two times a day (BID) | OROMUCOSAL | Status: DC

## 2014-09-09 MED ORDER — ASPIRIN 81 MG PO TBEC: 81.0000 mg | DELAYED_RELEASE_TABLET | Freq: Every day | ORAL | Status: AC

## 2014-09-09 MED ORDER — CIPROFLOXACIN HCL 250 MG PO TABS: 250.0000 mg | ORAL_TABLET | Freq: Two times a day (BID) | ORAL | Status: AC

## 2014-09-09 NOTE — Progress Notes (Addendum)
CSW (Clinical Education officer, museum) prepared pt [employer]. Will need signed FL2 and Gold DNR form. Pt nurse informed and will page MD. CSW to assist with transport after documents are signed.  ADDENDUM: CSW arranged non-emergent ambulance. Pt nurse, family, and facility aware. CSW signing off.  [place], [place]

## 2014-09-09 NOTE — Progress Notes (Signed)
OT Cancellation Note  Patient Details Name: Danielle Roth MRN: [id number] DOB: 1918/05/17   Cancelled Treatment:    Reason Eval/Treat Not Completed: OT screened. Pt planning to d/c to SNF and bed offers have been made. Defer all further needs to SNF.  Benito Mccreedy  OTR/L [phone]  09/09/2014, 8:42 AM

## 2014-09-09 NOTE — Discharge Summary (Signed)
DISCHARGE SUMMARY  Danielle Roth  MR#: [id number]  DOB:1918-07-02  Date of Admission: 09/05/2014 Date of Discharge: 09/09/2014  Attending Physician:Ryker Pherigo ALAN  Patient's YKD:XIPJA,SNKNLZJ Antony Haste, MD  Consults: None  Discharge Diagnoses: Principal Problem: TIA, resolved Atrial fibrillation with rapid ventricular rate, improved  UTI, on therapy History of pulmonary embolus with long-term anticoagulation  Unsteady gait with generalized weakness, persistent    Essential hypertension, benign   DYSPHAGIA, mild   Localization-related (focal) (partial) epilepsy and epileptic syndromes with complex partial seizures, without mention of intractable epilepsy   Cardiac pacemaker in situ   Unspecified protein-calorie malnutrition Bladder instability   macular degeneration   GERD Hyperlipidemia Glaucoma on medication Normocytic anemia Small pericardial effusion, not hemodynamically important Preserved ejection fraction Minor carotid stenosis  Discharge Medications:   Medication List    STOP taking these medications       hydrochlorothiazide 12.5 MG capsule  Commonly known as:  MICROZIDE      TAKE these medications       ACETAMIN PO  Take 2 tablets by mouth every 12 (twelve) hours as needed (pain).     allopurinol 300 MG tablet  Commonly known as:  ZYLOPRIM  Take 300 mg by mouth at bedtime.     aspirin 81 MG EC tablet  Take 1 tablet (81 mg total) by mouth daily.     bimatoprost 0.01 % Soln  Commonly known as:  LUMIGAN  Place 1 drop into both eyes at bedtime.     ciprofloxacin 250 MG tablet  Commonly known as:  CIPRO  Take 1 tablet (250 mg total) by mouth 2 (two) times daily.     diltiazem 120 MG 24 hr capsule  Commonly known as:  CARDIZEM CD  Take 1 capsule (120 mg total) by mouth daily.     ELIQUIS 2.5 MG Tabs tablet  Generic drug:  apixaban  Take 2.5 mg by mouth 2 (two) times daily.     levETIRAcetam 250 MG tablet  Commonly known as:  KEPPRA   Take 1 tablet (250 mg total) by mouth every 12 (twelve) hours.     multivitamin with minerals Tabs tablet  Take 1 tablet by mouth daily.     omeprazole 20 MG capsule  Commonly known as:  PRILOSEC  Take 20 mg by mouth daily.     polyethylene glycol packet  Commonly known as:  MIRALAX / GLYCOLAX  Take 17 g by mouth daily as needed (constipation).     pravastatin 40 MG tablet  Commonly known as:  PRAVACHOL  Take 40 mg by mouth daily.     SANCTURA XR 60 MG Cp24  Generic drug:  Trospium Chloride  Take 60 mg by mouth 2 (two) times daily.     timolol 0.5 % ophthalmic solution  Commonly known as:  TIMOPTIC  Place 1 drop into both eyes 2 (two) times daily.        Hospital Procedures: Ct Head Wo Contrast  09/05/2014   CLINICAL DATA:  80 year old female with altered mental status and confusion.  EXAM: CT HEAD WITHOUT CONTRAST  TECHNIQUE: Contiguous axial images were obtained from the base of the skull through the vertex without intravenous contrast.  COMPARISON:  07/16/2014  FINDINGS: Atrophy and chronic small-vessel white matter ischemic changes again noted.  No acute intracranial abnormalities are identified, including mass lesion or mass effect, hydrocephalus, extra-axial fluid collection, midline shift, hemorrhage, or acute infarction.  The visualized bony calvarium is unremarkable.  IMPRESSION: No evidence of acute intracranial abnormality.  Atrophy and chronic small-vessel white matter ischemic changes.   Electronically Signed   By: Hassan Rowan M.D.   On: 09/05/2014 22:41    History of Present Illness:  weakness  Hospital Course:  is a 80 year old white female with a history of atrial fibrillation, macular degeneration, and unstable gait. She recently been in the skilled nursing facility for UTI and had recovered somewhat well. She came in now with weakness and inability to walk. It appears this is a transient ischemic attack that has resolved. CT was negative which can't have an MRI  due to her pacemaker in place. Her neurologic status has slowly improved during this hospitalization. Echo and carotid evaluation were unremarkable for correctable causes. Due to her limited vision she does have some on-and-off hallucinations they get worse when she is ill. She also developed A. fib with RVR this hospitalization and got a Cardizem drip for couple days. She's now been transitioned to oral agents. She is eating well. She's had no choking problems. She still globally weak. She will need skilled nursing care with OT and PT. She responded remarkably well to this recently. The only medications are unchanged is that her Cloyd Stagers was on hold while she was here and she is now off HCTZ. Whether or not she'll need long-term Cardizem is yet to be determined. She is tolerating it from a blood pressure standpoint.   the family is requesting a goals of care meeting. We discussed the most form. She's had back-to-back illnesses now 3 or 4 times in the last year. Each time she's lost a bit of her oral function. I'm not sure whether or not she's a new phase of her age and disease process.  Day of Discharge Exam BP 123/49  Pulse 79  Temp(Src) 98.4 F (36.9 C) (Oral)  Resp 16  Ht 5\' 6"  (1.676 m)  Wt 61.961 kg (136 lb 9.6 oz)  BMI 22.06 kg/m2  SpO2 96%  Physical Exam: General appearance: a bit frail but nontoxic extraocular movements are intact no nystagmus sclera anicteric  Resp: Clear with no wheezes rales or rhonchi, no excess her muscles are in use.  Cardio: irregularly irregular with a systolic murmur  GI: soft, non-tender; bowel sounds normal; no masses,  no organomegaly Extremities slightly reduced pulses with 1+ edema  Neuro: She is awake alert,  recognizes me. She is globally weak. Grip is equal bilaterally. No resting tremor is present.  Discharge Labs:  Recent Labs  09/08/14 0330  NA 136*  K 4.6  CL 101  CO2 23  GLUCOSE 110*  BUN 20  CREATININE 0.77  CALCIUM 9.8      Recent Labs  09/08/14 0330  WBC 5.8  HGB 10.3*  HCT 31.2*  MCV 92.9  PLT 181   Results for Danielle Roth, Danielle Roth (MRN [id number]) as of 09/09/2014 11:15  Ref. Range 09/07/2014 05:03  Cholesterol Latest Range: 0-200 mg/dL 110  Triglycerides Latest Range: <150 mg/dL 91  HDL Latest Range: >39 mg/dL 28 (L)  LDL (calc) Latest Range: 0-99 mg/dL 64  VLDL Latest Range: 0-40 mg/dL 18  Total CHOL/HDL Ratio No range found 3.9   Bilateral: mild mixed plaque with acoustic shadowing mid CCA and origin and proximal ICA and ECA. 1-39% ICA stenosis. Vertebral artery flow is antegrade.  Left ventricle: The cavity size was normal. Systolic function was normal. The estimated ejection fraction was in the range of 55% to 60%. Wall motion was normal; there were no regional wall motion abnormalities. Doppler parameters  are consistent with abnormal left ventricular relaxation (grade 1 diastolic dysfunction). There was no evidence of elevated ventricular filling pressure by Doppler parameters. - Aortic valve: Trileaflet; mildly thickened, mildly calcified leaflets. There was no regurgitation. - Aortic root: The aortic root was normal in size. - Mitral valve: There was mild regurgitation. - Left atrium: The atrium was mildly dilated. - Right ventricle: Pacer wire or catheter noted in right ventricle. Systolic function was normal. - Right atrium: Pacer wire or catheter noted in right atrium. - Tricuspid valve: There was trivial regurgitation. - Pulmonic valve: There was no regurgitation. - Pulmonary arteries: PA peak pressure: 36 mm Hg (S). - Pericardium, extracardiac: A trivial pericardial effusion was identified posterior to the heart. Features were not consistent with tamponade physiology.     Discharge instructions: To skilled care. Have speech physical and occupational therapy.   Disposition: [facility]  Follow-up Appts: Follow-up with [name] at [facility] when released from the skilled care facility Call for appointment.  Condition on Discharge: Improved  Tests Needing Follow-up: None  Signed: Trung Wenzl ALAN 09/09/2014, 11:08 AM

## 2014-09-09 NOTE — Progress Notes (Signed)
Physical Therapy Treatment Patient Details Name: VERONCIA JEZEK MRN: [id number] DOB: 1918-08-02 Today's Date: 09/09/2014    History of Present Illness 80 y.o. female admitted to [facility] on 09/05/14 from [place] independent living due to sudden onset weakness and inability to walk.  Stroke workup in progress.  CT negative and pt unable to have an MRI due to pacemaker.  She was noted to have some symptoms consistant with FTT and a possible recurrance of her UTI.  Pt with significant PMhx of HTN, PAF, stroke, gout, SSS, PE, partial seizure, MI, breast CA with left mastectomy and restricted L extremity.    PT Comments    Pt pleasantly confused, hallucinating visitors on arrival. Pt's dgtrs arrived during session stating pt was able to sit EOB and stand with family yesterday without posterior lean, pt unable to duplicate today with 2 person assist. Pt will continue to need assist of lift equipment for transfers if unable to decrease posterior push. Will continue to follow and educated dgtrs of recommendation for OOB daily with nursing staff via lift equipment at this time. Family also encouraged to perform HEP with pt.  Follow Up Recommendations  SNF     Equipment Recommendations       Recommendations for Other Services       Precautions / Restrictions Precautions Precautions: Fall Precaution Comments: pt fearful of falling, posterior push with all mobility    Mobility  Bed Mobility Overal bed mobility: Needs Assistance;+2 for physical assistance Bed Mobility: Rolling;Sidelying to Sit Rolling: Mod assist;+2 for physical assistance Sidelying to sit: Max assist;+2 for physical assistance       General bed mobility comments: cues and hand over hand assist to bend knee, reach for rail and rotate trunk. pt with assist to elevate trunk with maintained posterior lean in sitting despite multimodal commands  Transfers Overall transfer level: Needs assistance   Transfers: Sit to/from  Stand;Stand Pivot Transfers Sit to Stand: Total assist;+2 physical assistance Stand pivot transfers: Max assist;+2 physical assistance       General transfer comment: attempted standing with 2 person assist from bed but unable due to significant posterior push. Used [employer] to stand and pivot pt from bed with pt able to come forward to clear sacrum but even in sara maintaining hip flexion with posterior lean with 2 person assist for safety to pivot to chair.   Ambulation/Gait                 Stairs            Wheelchair Mobility    Modified Rankin (Stroke Patients Only)       Balance Overall balance assessment: Needs assistance Sitting-balance support: Feet supported Sitting balance-Leahy Scale: Zero Sitting balance - Comments: posterior lean   Standing balance support: Bilateral upper extremity supported Standing balance-Leahy Scale: Zero                      Cognition Arousal/Alertness: Awake/alert Behavior During Therapy: Anxious Overall Cognitive Status: Impaired/Different from baseline Area of Impairment: Orientation;Memory;Following commands;Safety/judgement;Awareness;Problem solving Orientation Level: Disoriented to;Situation;Time   Memory: Decreased short-term memory Following Commands: Follows one step commands inconsistently Safety/Judgement: Decreased awareness of safety;Decreased awareness of deficits   Problem Solving: Slow processing;Decreased initiation;Difficulty sequencing;Requires verbal cues;Requires tactile cues General Comments: On arrival pt with visual hallucinations of seeing 3 friends present in room who were not present. Pt pushing posteirorly despite multimodal cues and attempts for weight shift    Exercises General Exercises -  Lower Extremity Long Arc Quad: AROM;Seated;Both;10 reps    General Comments        Pertinent Vitals/Pain Pain Assessment: No/denies pain    Home Living                      Prior  Function            PT Goals (current goals can now be found in the care plan section) Progress towards PT goals: Progressing toward goals (limited by decreased cognition and posterior push)    Frequency       PT Plan Current plan remains appropriate    Co-evaluation             End of Session Equipment Utilized During Treatment: Gait belt Activity Tolerance: Patient tolerated treatment well Patient left: in chair;with call bell/phone within reach;with chair alarm set;with family/visitor present     Time: 5374-8270 PT Time Calculation (min): 30 min  Charges:  $Therapeutic Activity: 23-37 mins                    G Codes:      Melford Aase 2014/10/09, 10:32 AM Elwyn Reach, [place]

## 2014-09-30 ENCOUNTER — Telehealth: Payer: Self-pay | Admitting: Cardiology

## 2014-09-30 NOTE — Telephone Encounter (Signed)
Pt hospice nurse called and wanted to know if pt had PPM or ICD. I informed her that it was a PPM.

## 2014-10-11 ENCOUNTER — Telehealth: Payer: Self-pay | Admitting: Cardiology

## 2014-10-11 ENCOUNTER — Telehealth: Payer: Self-pay | Admitting: Internal Medicine

## 2014-10-11 NOTE — Telephone Encounter (Signed)
Noted in [employer]. 

## 2014-10-11 NOTE — Telephone Encounter (Signed)
Pt daughter called and stated that pt passed away.

## 2014-10-11 NOTE — Telephone Encounter (Signed)
New message     FYI Pt died 10/18/14.  She was in hospice.

## 2014-10-20 DEATH — deceased

## 2014-12-24 ENCOUNTER — Ambulatory Visit: Payer: Medicare Other | Admitting: Neurology
